# Patient Record
Sex: Female | Born: 1980
Health system: Southern US, Community
[De-identification: ages and names within clinical notes are randomized; demographics above are authoritative.]

## PROBLEM LIST (undated history)

## (undated) ENCOUNTER — Inpatient Hospital Stay (HOSPITAL_COMMUNITY): Payer: 59

## (undated) DIAGNOSIS — Z8759 Personal history of other complications of pregnancy, childbirth and the puerperium: Secondary | ICD-10-CM

## (undated) DIAGNOSIS — M549 Dorsalgia, unspecified: Secondary | ICD-10-CM

## (undated) DIAGNOSIS — F419 Anxiety disorder, unspecified: Secondary | ICD-10-CM

## (undated) DIAGNOSIS — F329 Major depressive disorder, single episode, unspecified: Secondary | ICD-10-CM

## (undated) DIAGNOSIS — B999 Unspecified infectious disease: Secondary | ICD-10-CM

## (undated) DIAGNOSIS — D649 Anemia, unspecified: Secondary | ICD-10-CM

## (undated) DIAGNOSIS — F32A Depression, unspecified: Secondary | ICD-10-CM

## (undated) DIAGNOSIS — IMO0002 Reserved for concepts with insufficient information to code with codable children: Secondary | ICD-10-CM

## (undated) DIAGNOSIS — S42309A Unspecified fracture of shaft of humerus, unspecified arm, initial encounter for closed fracture: Secondary | ICD-10-CM

## (undated) DIAGNOSIS — G8929 Other chronic pain: Secondary | ICD-10-CM

## (undated) DIAGNOSIS — F909 Attention-deficit hyperactivity disorder, unspecified type: Secondary | ICD-10-CM

## (undated) HISTORY — DX: Reserved for concepts with insufficient information to code with codable children: IMO0002

## (undated) HISTORY — DX: Anemia, unspecified: D64.9

## (undated) HISTORY — PX: TONSILLECTOMY: SUR1361

## (undated) HISTORY — DX: Unspecified fracture of shaft of humerus, unspecified arm, initial encounter for closed fracture: S42.309A

## (undated) HISTORY — DX: Anxiety disorder, unspecified: F41.9

## (undated) HISTORY — PX: BREAST REDUCTION SURGERY: SHX8

## (undated) HISTORY — DX: Dorsalgia, unspecified: M54.9

## (undated) HISTORY — DX: Other chronic pain: G89.29

## (undated) HISTORY — DX: Unspecified infectious disease: B99.9

## (undated) HISTORY — DX: Attention-deficit hyperactivity disorder, unspecified type: F90.9

## (undated) HISTORY — DX: Depression, unspecified: F32.A

---
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
## 2000-06-29 ENCOUNTER — Other Ambulatory Visit: Admission: RE | Admit: 2000-06-29 | Discharge: 2000-06-29 | Payer: Self-pay | Admitting: Obstetrics and Gynecology

## 2000-11-03 DIAGNOSIS — F419 Anxiety disorder, unspecified: Secondary | ICD-10-CM

## 2000-11-03 HISTORY — DX: Anxiety disorder, unspecified: F41.9

## 2000-11-03 HISTORY — PX: BREAST SURGERY: SHX581

## 2001-07-23 ENCOUNTER — Other Ambulatory Visit: Admission: RE | Admit: 2001-07-23 | Discharge: 2001-07-23 | Payer: Self-pay | Admitting: Obstetrics and Gynecology

## 2001-11-03 DIAGNOSIS — F909 Attention-deficit hyperactivity disorder, unspecified type: Secondary | ICD-10-CM

## 2001-11-03 HISTORY — DX: Attention-deficit hyperactivity disorder, unspecified type: F90.9

## 2003-11-04 DIAGNOSIS — R87619 Unspecified abnormal cytological findings in specimens from cervix uteri: Secondary | ICD-10-CM

## 2003-11-04 DIAGNOSIS — IMO0002 Reserved for concepts with insufficient information to code with codable children: Secondary | ICD-10-CM

## 2003-11-04 HISTORY — DX: Reserved for concepts with insufficient information to code with codable children: IMO0002

## 2003-11-04 HISTORY — DX: Unspecified abnormal cytological findings in specimens from cervix uteri: R87.619

## 2007-07-22 ENCOUNTER — Encounter: Admission: RE | Admit: 2007-07-22 | Discharge: 2007-07-22 | Payer: Self-pay | Admitting: Obstetrics and Gynecology

## 2007-08-02 ENCOUNTER — Inpatient Hospital Stay (HOSPITAL_COMMUNITY): Admission: AD | Admit: 2007-08-02 | Discharge: 2007-08-03 | Payer: Self-pay | Admitting: Obstetrics and Gynecology

## 2007-08-04 ENCOUNTER — Inpatient Hospital Stay (HOSPITAL_COMMUNITY): Admission: AD | Admit: 2007-08-04 | Discharge: 2007-08-06 | Payer: Self-pay | Admitting: Obstetrics and Gynecology

## 2011-03-04 ENCOUNTER — Emergency Department (HOSPITAL_COMMUNITY): Payer: 59

## 2011-03-04 ENCOUNTER — Emergency Department (HOSPITAL_COMMUNITY)
Admission: EM | Admit: 2011-03-04 | Discharge: 2011-03-04 | Disposition: A | Discharge status: Home / Self Care | Payer: 59 | Attending: Emergency Medicine | Admitting: Emergency Medicine

## 2011-03-04 DIAGNOSIS — R079 Chest pain, unspecified: Secondary | ICD-10-CM | POA: Insufficient documentation

## 2011-03-04 DIAGNOSIS — R42 Dizziness and giddiness: Secondary | ICD-10-CM | POA: Insufficient documentation

## 2011-03-18 NOTE — H&P (Signed)
Monica Mahoney               ACCOUNT NO.:  [id number]   MEDICAL RECORD NO.:  [id number]          PATIENT TYPE:  INP   LOCATION:  9167                          FACILITY:  [facility]   PHYSICIAN:  Hal Morales, M.D.DATE OF BIRTH:  10/24/1981   DATE OF ADMISSION:  08/04/2007  DATE OF DISCHARGE:                              HISTORY & PHYSICAL   [name] is a 30 year old married white female primigravida at 40  weeks, who presents with regular contractions every 4 minutes since 3  a.m.  She denies leaking, is having a small amount of bloody show.  Her  pregnancy has been followed by the [facility] certified  nurse midwife service and has been remarkable for:   1. Transfer to [facility] at 20 weeks' gestation.  2. Group B strep negative.   Her cervix in the office yesterday was 2 cm, 100% effaced.  She has an  induction of labor scheduled for August 06, 2007.  Prenatal labs were  collected on Mar 16, 2007.  Hemoglobin 13.3, hematocrit 37.9, platelets  253,000.  Blood type A+, antibody negative.  RPR nonreactive.  Hepatitis  B surface negative.  HIV nonreactive.  Pap smear within normal limits.  Gonorrhea negative.  Chlamydia negative.  Hemoglobin electrophoresis  within normal limits.  Varicella immune.  One-hour Glucola on May 04, 2007, was within normal limits.  Hemoglobin at that time was 12.5.  Culture of the vaginal tract for group B strep, gonorrhea and chlamydia  on July 02, 2007, were all negative.   HISTORY OF PRESENT PREGNANCY:  The patient presented for care at [facility] on Mar 16, 2007, at 19-6/7 weeks' gestation.  Anatomy  ultrasound on that date shows growth consistent with previous dating,  confirming EDC of August 04, 2007.  The patient had prenatal care in the  first trimester at a different location.  Ultrasound for fluid and  growth was done at 33 weeks' gestation and growth was within normal  limits, and AFI was at the 50th  percentile.  At 37 weeks the patient was  given Flexeril and Darvocet for back pain.  The rest of her prenatal  care has been unremarkable.   OB HISTORY:  She is a primigravida.   MEDICAL HISTORY:  She has no medication allergies.  She experienced  menarche at the age of 13 with 28-day cycles lasting 3-4 days.  She has  taken Seasonale and Yaz in the past for contraception.  She has had  history of abnormal Pap smears with colposcopy and 2005.  She reports  having had the usual childhood illnesses.   SURGICAL HISTORY:  Remarkable for tonsillectomy in 1991 and breast  reduction in 2002.   FAMILY MEDICAL HISTORY:  Paternal grandfather with heart disease.  The  patient's mother with chronic hypertension and also with varicose veins.  Maternal uncle and paternal grandmother with diabetes.  Maternal  grandmother with breast cancer.  Paternal grandparents with skin cancer.   GENETIC HISTORY:  Remarkable for history of kidney problems on the  father of the baby's side of the family.  SOCIAL HISTORY:  The patient is married.  Father of the baby's name is  Olena Leatherwood.  He is involved and supportive.  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  Father of baby has 15 years of  education and is a full-time paramedic.  They deny any alcohol, tobacco  or illicit drug use with the pregnancy.   OBJECTIVE:  VITAL SIGNS:  Stable.  She is afebrile.  HEENT:  Grossly within normal limits.  CHEST:  Clear to auscultation.  HEART:  Regular rate and rhythm.  ABDOMEN:  Gravid in contour with fundal height extending approximately  39 cm above the pubic symphysis.  Fetal heart rate is reactive and  reassuring.  Contractions are every 4-5 minutes.  Cervix is 3 cm, 100%  effaced, vertex -1, with intact membranes.  EXTREMITIES:  Normal.   ASSESSMENT:  1. Early labor.  2. Group B strep negative.   PLAN:  1. Is to admit to birthing suites.  2. Routine C.N.M. orders.  3. Plans IV  pain medication at present.  4. Anticipate normal spontaneous vaginal birth.      Cam Hai, C.N.M.      Hal Morales, M.D.  Electronically Signed    KS/MEDQ  D:  08/04/2007  T:  08/04/2007  Job:  [id number]

## 2011-03-18 NOTE — Discharge Summary (Signed)
NAME:  Monica Mahoney, Monica Mahoney               ACCOUNT NO.:  [id number]   MEDICAL RECORD NO.:  [id number]          PATIENT TYPE:  INP   LOCATION:  9108                          [facility]ACILITY:  [facility]   PHYSICIAN:  Monica Mahoney, M.D. DATE OF BIRTH:  1981-05-07   DATE OF ADMISSION:  08/04/2007  DATE OF DISCHARGE:  08/06/2007                               DISCHARGE SUMMARY   ADMITTING DIAGNOSES:  1. Intrauterine pregnancy at 40 weeks.  2. Transfer to [facility] at [redacted] weeks gestation.  3. Group B strep-negative.   DISCHARGE DIAGNOSES:  1. History of pregnancy at term.  2. Maternal exhaustion.   PROCEDURES:  1. Vacuum-assisted vaginal birth.  2. Epidural anesthesia.   HOSPITAL COURSE:  [name] is a 30 year old gravida 1, para 0 at 40  weeks who presented in early labor on the morning of August 04, 2007.  Her pregnancy had been remarkable for (1) transfer at 20 weeks to  [place], and (2) group B strep-negative.  On admission, she was  2 cm, 100%, vertex at a -2 station.  She ambulated for 1 hour.  Cervix  then changed to 3, and she was admitted for labor.  She had artificial  rupture of membranes accomplished at 12:55 p.m. with blood tinged fluid  noted.  Intrauterine pressure catheter was placed.  Epidural was also  placed for comfort.  This did work well for her.  She progressed to  completely dilated with assistance from Pitocin at 8:00 p.m.  She pushed  for 2 hours.  Dr. Estanislado Pandy was consulted at that time.  The patient at  that time was complaining of maternal exhaustion.  The baby was crowning  to an OA position.  The mushroom cup was applied by Dr. Estanislado Pandy with 2/2  contractions, and a midline episiotomy was performed.  The infant was a  viable female by the name of Monica Mahoney, weight 7 pounds 5 ounces.  Apgars were  8 and 9.  There was a midline episiotomy repaired in the usual fashion.  The infant was taken to the full-term nursery.  Mother was taken to  recovery in good  condition.   By postpartum day #1, the patient was doing well.  She was up ad lib.  She did have one episode of slight dizziness, but that did pass.  She  was breast feeding.  Her hemoglobin was 10.4, white blood cell count was  25.6 which was up from 21.1, and platelet count was 235.  She had been  febrile just during the second stage and did receive one dose of Unasyn.   By postpartum day #2, the patient was doing well.  She was planning an  IUD.  Her hemoglobin was 9.5, white blood cell count was down to 7.2,  and platelet count was 215.  The patient was having no issues with  syncope or dizziness.  Her vital signs were stable.  Her fundus was  firm.  Her perineum was healing.  Her lochia was scant.  Extremities  were within normal limits.  She was deemed to have received full benefit  of  her hospital stay and was discharged home.   DISCHARGE INSTRUCTIONS:  Discharge instructions per [facility]  handout.   DISCHARGE MEDICATIONS:  1. Motrin 600 mg p.o. q.6h. p.r.n. pain.  2. Percocet 5/325 1-2 p.o. q.3-4h. p.r.n. pain.   FOLLOW UP:  Discharge followup will occur in 5 weeks at [facility] for planned IUD at 6 weeks.      Monica Mahoney, C.N.M.      Monica Mahoney, M.D.  Electronically Signed    VLL/MEDQ  D:  08/06/2007  T:  08/06/2007  Job:  [id number]

## 2011-08-14 LAB — CBC
HCT: 26.8 — ABNORMAL LOW
HCT: 29 — ABNORMAL LOW
HCT: 37.9
Hemoglobin: 10.4 — ABNORMAL LOW
Hemoglobin: 13.3
Hemoglobin: 9.5 — ABNORMAL LOW
MCHC: 35.2
MCHC: 35.5
MCHC: 35.7
MCV: 97.3
MCV: 97.4
MCV: 97.7
Platelets: 215
Platelets: 235
Platelets: 262
RBC: 2.75 — ABNORMAL LOW
RBC: 2.97 — ABNORMAL LOW
RBC: 3.89
RDW: 12.5
RDW: 12.8
RDW: 12.8
WBC: 17.2 — ABNORMAL HIGH
WBC: 21.1 — ABNORMAL HIGH
WBC: 25.6 — ABNORMAL HIGH

## 2011-08-14 LAB — RPR: RPR Ser Ql: NONREACTIVE

## 2012-02-10 ENCOUNTER — Emergency Department (HOSPITAL_COMMUNITY)
Admission: EM | Admit: 2012-02-10 | Discharge: 2012-02-10 | Disposition: A | Discharge status: Home / Self Care | Payer: 59 | Source: Home / Self Care | Attending: Emergency Medicine | Admitting: Emergency Medicine

## 2012-02-10 ENCOUNTER — Encounter (HOSPITAL_COMMUNITY): Payer: Self-pay

## 2012-02-10 DIAGNOSIS — J040 Acute laryngitis: Secondary | ICD-10-CM

## 2012-02-10 HISTORY — DX: Attention-deficit hyperactivity disorder, unspecified type: F90.9

## 2012-02-10 MED ORDER — AMOXICILLIN 500 MG PO CAPS: 500.0000 mg | ORAL_CAPSULE | Freq: Three times a day (TID) | ORAL | Status: AC

## 2012-02-10 MED ORDER — FEXOFENADINE-PSEUDOEPHED ER 60-120 MG PO TB12: 1.0000 | ORAL_TABLET | Freq: Two times a day (BID) | ORAL | Status: DC

## 2012-02-10 NOTE — ED Provider Notes (Signed)
History     CSN: [id number]  Arrival date & time 02/10/12  0805   First MD Initiated Contact with Patient 02/10/12 [phone]      Chief Complaint  Patient presents with  . Cough  . Sore Throat    (Consider location/radiation/quality/duration/timing/severity/associated sxs/prior treatment) HPI Comments: Patient presents urgent care complaining of 3 day history of scratchy throat and her voice has been gotten hoarser. Patient describes cough goes from dry to some phlegm production. Got off a work shift feeling tired and fatigued "just not feeling well" its. No further symptoms such as shortness of breath, vomiting or nausea.. Denies any known fevers  Patient is a 31 y.o. female presenting with cough and pharyngitis. The history is provided by the patient.  Cough This is a new problem. The current episode started more than 2 days ago. The problem occurs constantly. The cough is productive of sputum. Associated symptoms include ear congestion and rhinorrhea. Pertinent negatives include no chest pain, no chills, no myalgias and no shortness of breath. She has tried decongestants for the symptoms. The treatment provided no relief. Her past medical history does not include pneumonia, COPD or asthma.  Sore Throat Pertinent negatives include no chest pain and no shortness of breath.    Past Medical History  Diagnosis Date  . ADHD (attention deficit hyperactivity disorder)     Past Surgical History  Procedure Date  . Breast reduction surgery   . Tonsillectomy     History reviewed. No pertinent family history.  History  Substance Use Topics  . Smoking status: Never Smoker   . Smokeless tobacco: Not on file  . Alcohol Use: Yes    OB History    Grav Para Term Preterm Abortions TAB SAB Ect Mult Living                  Review of Systems  Constitutional: Positive for appetite change and fatigue. Negative for chills.  HENT: Positive for congestion and rhinorrhea. Negative for neck pain  and neck stiffness.   Eyes: Negative for photophobia, pain and visual disturbance.  Respiratory: Positive for cough. Negative for shortness of breath.   Cardiovascular: Negative for chest pain, palpitations and leg swelling.  Genitourinary: Negative for dysuria.  Musculoskeletal: Negative for myalgias.    Allergies  Review of patient's allergies indicates no known allergies.  Home Medications   Current Outpatient Rx  Name Route Sig Dispense Refill  . ADDERALL XR PO Oral Take by mouth daily.    . AMOXICILLIN 500 MG PO CAPS Oral Take 1 capsule (500 mg total) by mouth 3 (three) times daily. 30 capsule 0  . FEXOFENADINE-PSEUDOEPHED ER 60-120 MG PO TB12 Oral Take 1 tablet by mouth every 12 (twelve) hours. 30 tablet 0    BP 137/79  Pulse 70  Temp(Src) 98.1 F (36.7 C) (Oral)  Resp 12  SpO2 98%  Physical Exam  Nursing note and vitals reviewed. Constitutional: She appears well-developed and well-nourished.  HENT:  Head: Normocephalic.  Right Ear: Tympanic membrane normal.  Left Ear: Tympanic membrane normal.  Mouth/Throat: Oropharynx is clear and moist and mucous membranes are normal. No oropharyngeal exudate, posterior oropharyngeal edema, posterior oropharyngeal erythema or tonsillar abscesses.  Eyes: Conjunctivae are normal.  Neck: Neck supple.  Cardiovascular: Normal rate.   Pulmonary/Chest: No respiratory distress. She has no decreased breath sounds. She has no wheezes. She has no rhonchi. She has no rales.  Lymphadenopathy:    She has no cervical adenopathy.  Skin: No rash  noted. No erythema.    ED Course  Procedures (including critical care time)  Labs Reviewed - No data to display No results found.   1. Laryngitis       MDM  Laryngitis symptomatology. Afebrile. Patient has some mild upper respiratory coexistent symptoms associated with. Patient looks comfortable afebrile. Symptomatic management recommended the next 40 872 hours prior to antibiotic usage along  with aggressive oral and upper airways hydration with a humidifier. Patient agree with treatment plan and followup care as necessary        Jimmie Molly, MD 02/10/12 1027

## 2012-02-10 NOTE — Discharge Instructions (Signed)
AS. discussed proceed to hydrate yourself aggressively use a humidifier at night, and start with a structured regimen with Allegra-D , if symptoms worsens or persists beyond 10-14 days it's reasonable to take an antibiotic.   Laryngitis Your exam shows you have laryngitis. This condition is due to inflammation around the vocal cords and causes hoarseness and cough. Laryngitis can often be related to a virus infection, excessive smoking, excessive talking or yelling, breathing toxic fumes, allergies, or reflux of acid from your stomach. Treatment is mainly voice rest. Talk as little as possible (this includes whispering). Use written notes to communicate until your voice is back to normal. Avoid smoking cigarettes, drink plenty of clear liquids, and rest frequently until all your symptoms have improved. You should be checked by your doctor if your hoarseness and cough are not improved after 5 days. Please see your doctor or go to the emergency right away if you have:  Trouble breathing or blood in your sputum.   Difficulty swallowing, persistent fever or increasing pain.  Document Released: 10/20/2005 Document Revised: 07/02/2011 Document Reviewed: 04/07/2007 [facility] Patient Information 2012 [place], [place].Laryngitis At the top of your windpipe is your voice box. It is the source of your voice. Inside your voice box are 2 bands of muscles called vocal cords. When you breathe, your vocal cords are relaxed and open so that air can get into the lungs. When you decide to say something, these cords come together and vibrate. The sound from these vibrations goes into your throat and comes out through your mouth as sound. Laryngitis is an inflammation of the vocal cords that causes hoarseness, cough, loss of voice, sore throat, and dry throat. Laryngitis can be temporary (acute) or long-term (chronic). Most cases of acute laryngitis improve with time.Chronic laryngitis lasts for more than 3  weeks. CAUSES Laryngitis can often be related to excessive smoking, talking, or yelling, as well as inhalation of toxic fumes and allergies. Acute laryngitis is usually caused by a viral infection, vocal strain, measles or mumps, or bacterial infections. Chronic laryngitis is usually caused by vocal cord strain, vocal cord injury, postnasal drip, growths on the vocal cords, or acid reflux. SYMPTOMS   Cough.   Sore throat.   Dry throat.  RISK FACTORS  Respiratory infections.   Exposure to irritating substances, such as cigarette smoke, excessive amounts of alcohol, stomach acids, and workplace chemicals.   Voice trauma, such as vocal cord injury from shouting or speaking too loud.  DIAGNOSIS  Your cargiver will perform a physical exam. During the physical exam, your caregiver will examine your throat. The most common sign of laryngitis is hoarseness. Laryngoscopy may be necessary to confirm the diagnosis of this condition. This procedure allows your caregiver to look into the larynx. HOME CARE INSTRUCTIONS  Drink enough fluids to keep your urine clear or pale yellow.   Rest until you no longer have symptoms or as directed by your caregiver.   Breathe in moist air.   Take all medicine as directed by your caregiver.   Do not smoke.   Talk as little as possible (this includes whispering).   Write on paper instead of talking until your voice is back to normal.   Follow up with your caregiver if your condition has not improved after 10 days.  SEEK MEDICAL CARE IF:   You have trouble breathing.   You cough up blood.   You have persistent fever.   You have increasing pain.   You have difficulty swallowing.  MAKE SURE YOU:  Understand these instructions.   Will watch your condition.   Will get help right away if you are not doing well or get worse.  Document Released: 10/20/2005 Document Revised: 10/09/2011 Document Reviewed: 12/26/2010 [facility] Patient Information  2012 [place], [place].

## 2012-02-10 NOTE — ED Notes (Signed)
Reports sx started 3 days ago with scratchy throat and hoarseness.  Has developed post nasal drainage and prod. Cough and just doesn't feel well.  Denies known fever.

## 2012-03-26 ENCOUNTER — Encounter: Payer: Self-pay | Admitting: Obstetrics and Gynecology

## 2012-03-31 ENCOUNTER — Encounter: Payer: Self-pay | Admitting: Obstetrics and Gynecology

## 2012-03-31 ENCOUNTER — Ambulatory Visit (INDEPENDENT_AMBULATORY_CARE_PROVIDER_SITE_OTHER): Payer: 59 | Admitting: Obstetrics and Gynecology

## 2012-03-31 VITALS — BP 120/74 | Resp 16 | Ht 61.5 in | Wt 121.0 lb

## 2012-03-31 DIAGNOSIS — Z309 Encounter for contraceptive management, unspecified: Secondary | ICD-10-CM

## 2012-03-31 NOTE — Progress Notes (Signed)
[name] is a 31 y.o. year old female,G1P1001, who presents for a problem visit. The patient is ready to conceive and she will her Mirena IUD removed.  Subjective:  Excited about getting pregnant.  Objective:  BP 120/74  Resp 16  Ht 5' 1.5" (1.562 m)  Wt 121 lb (54.885 kg)  BMI 22.49 kg/m2   GI: soft, non-tender; bowel sounds normal; no masses,  no organomegaly  External genitalia: normal general appearance Vaginal: normal without tenderness, induration or masses Cervix: normal appearance and IUD string visualized Adnexa: normal bimanual exam Uterus: normal size shape and consistency  Procedure:  IUD string visualized.  Ring forceps used to remove the IUD without complications.  Tolerated well.  Assessment:  Once Mirena IUD removed Preconception  Plan:  The patient was told to begin prenatal vitamins.  Cats, soft cheese, undercooked lunch meat, predator fish, exercise, and diet were all reviewed.  Prenatal laboratory testing discussed but declined.  Return to office in time for her annual exam or once she becomes pregnant.   Leonard Schwartz M.D.  03/31/2012 11:33 AM

## 2012-04-08 ENCOUNTER — Ambulatory Visit: Payer: Self-pay | Admitting: Obstetrics and Gynecology

## 2012-06-22 ENCOUNTER — Encounter: Payer: Self-pay | Admitting: Obstetrics and Gynecology

## 2012-06-22 ENCOUNTER — Ambulatory Visit (INDEPENDENT_AMBULATORY_CARE_PROVIDER_SITE_OTHER): Payer: 59 | Admitting: Obstetrics and Gynecology

## 2012-06-22 ENCOUNTER — Telehealth: Payer: Self-pay | Admitting: Obstetrics and Gynecology

## 2012-06-22 VITALS — BP 104/68 | Resp 16 | Ht 61.0 in | Wt 125.0 lb

## 2012-06-22 DIAGNOSIS — Z3200 Encounter for pregnancy test, result unknown: Secondary | ICD-10-CM

## 2012-06-22 DIAGNOSIS — N939 Abnormal uterine and vaginal bleeding, unspecified: Secondary | ICD-10-CM

## 2012-06-22 DIAGNOSIS — Z202 Contact with and (suspected) exposure to infections with a predominantly sexual mode of transmission: Secondary | ICD-10-CM

## 2012-06-22 DIAGNOSIS — N898 Other specified noninflammatory disorders of vagina: Secondary | ICD-10-CM

## 2012-06-22 DIAGNOSIS — Z2089 Contact with and (suspected) exposure to other communicable diseases: Secondary | ICD-10-CM

## 2012-06-22 LAB — POCT URINE PREGNANCY: Preg Test, Ur: NEGATIVE

## 2012-06-22 LAB — POCT URINALYSIS DIPSTICK
Bilirubin, UA: NEGATIVE
Blood, UA: >3
Glucose, UA: NEGATIVE
Nitrite, UA: NEGATIVE
Spec Grav, UA: 1.02
Urobilinogen, UA: 4
pH, UA: 7

## 2012-06-22 NOTE — Telephone Encounter (Signed)
Pt called, states is 5 wks, and has been having vaginal bleeding.  Has to wear a pad, bldg is bright right in color.  Says picked up 40lb son today to put into the car and felt like something had pulled.  Last IC was last week.  Pt denies any pain.  Pt sched for eval today w/ VL today @ 1500.

## 2012-06-22 NOTE — Progress Notes (Signed)
lmp 05/16/2012 bleeding stared this a.m. Last intercourse last week.

## 2012-06-22 NOTE — Progress Notes (Signed)
Here for +UPT last month, now with bleeding today. No pain. Had Mirena removed in May.  Light cycle in June, heavy cycle in July. Desired pregnancy.  UPT today negative x 2. GC, chlamydia today. QHCG drawn. Will f/u with patient tomorrow re: [facility] result. Support to patient for likely SAB.

## 2012-06-23 ENCOUNTER — Telehealth: Payer: Self-pay

## 2012-06-23 LAB — GC/CHLAMYDIA PROBE AMP, GENITAL
Chlamydia, DNA Probe: NEGATIVE
GC Probe Amp, Genital: NEGATIVE

## 2012-06-23 LAB — HCG, QUANTITATIVE, PREGNANCY: hCG, Beta Chain, Quant, S: 17.9 m[IU]/mL

## 2012-06-23 NOTE — Telephone Encounter (Signed)
Pt was called and advised to come in one day next week to have quant done. Lab ordered. Pt canceled NOB appt. following miscarriage. Pt voice understanding. Mathis Bud

## 2012-06-25 ENCOUNTER — Telehealth: Payer: Self-pay

## 2012-06-25 NOTE — Telephone Encounter (Signed)
A user error has taken place: encounter opened in error, closed for administrative reasons.

## 2012-06-25 NOTE — Telephone Encounter (Signed)
Message copied by Janeece Agee on Fri Jun 25, 2012 10:49 AM ------      Message from: Cornelius Moras      Created: Wed Jun 23, 2012 11:13 AM       Please call patient and advise of very low QHCG, probable SAB.      Would recommend repeat in 1 week to ensure value drops to <5--this verifies the process has been completed.      Offer support and condolences....Marland KitchenMarland Kitchen

## 2012-06-25 NOTE — Telephone Encounter (Signed)
Pam called pt yesterday for me regarding lab quant result & another quant needed for next week per VL. Didn't see a quant scheduled for next week. Called pt to schedule the appt. Scheduled pt 06/30/12 @10am  for the quant. Pt agrees and voices understanding.

## 2012-06-30 ENCOUNTER — Other Ambulatory Visit: Payer: 59

## 2012-07-01 ENCOUNTER — Other Ambulatory Visit: Payer: 59

## 2012-07-01 DIAGNOSIS — O039 Complete or unspecified spontaneous abortion without complication: Secondary | ICD-10-CM

## 2012-07-01 LAB — HCG, QUANTITATIVE, PREGNANCY: hCG, Beta Chain, Quant, S: <1 m[IU]/mL

## 2012-07-01 NOTE — Progress Notes (Signed)
Quick Note:  QHCG has resolved to negative--please call patient and advise. No further QHCGs needed  ______

## 2012-07-06 ENCOUNTER — Telehealth: Payer: Self-pay

## 2012-07-06 NOTE — Telephone Encounter (Signed)
Spoke with pt informing her Monica Mahoney is neg and no further testing is needed. Pt also questioned if pap was done during 06/22/12. Informed pt only GC/CT which was neg. Pt agrees and voices understanding.

## 2012-09-08 ENCOUNTER — Ambulatory Visit (INDEPENDENT_AMBULATORY_CARE_PROVIDER_SITE_OTHER): Payer: 59 | Admitting: Obstetrics and Gynecology

## 2012-09-08 DIAGNOSIS — O26849 Uterine size-date discrepancy, unspecified trimester: Secondary | ICD-10-CM

## 2012-09-08 DIAGNOSIS — Z331 Pregnant state, incidental: Secondary | ICD-10-CM

## 2012-09-08 LAB — POCT URINALYSIS DIPSTICK
Bilirubin, UA: NEGATIVE
Blood, UA: NEGATIVE
Glucose, UA: NEGATIVE
Ketones, UA: NEGATIVE
Leukocytes, UA: NEGATIVE
Nitrite, UA: NEGATIVE
Protein, UA: NEGATIVE
Spec Grav, UA: 1.02
Urobilinogen, UA: NEGATIVE
pH, UA: 5

## 2012-09-08 NOTE — Progress Notes (Signed)
NOB Interview. Per protocol scheduled for U/s for dating/viability.

## 2012-09-09 LAB — PRENATAL PANEL VII
Antibody Screen: NEGATIVE
Basophils Absolute: 0 10*3/uL (ref 0.0–0.1)
Basophils Relative: 0 % (ref 0–1)
Eosinophils Absolute: 0.1 10*3/uL (ref 0.0–0.7)
Eosinophils Relative: 1 % (ref 0–5)
HCT: 37.5 % (ref 36.0–46.0)
HIV: NONREACTIVE
Hemoglobin: 12.9 g/dL (ref 12.0–15.0)
Hepatitis B Surface Ag: NEGATIVE
Lymphocytes Relative: 24 % (ref 12–46)
Lymphs Abs: 2.1 10*3/uL (ref 0.7–4.0)
MCH: 32.5 pg (ref 26.0–34.0)
MCHC: 34.4 g/dL (ref 30.0–36.0)
MCV: 94.5 fL (ref 78.0–100.0)
Monocytes Absolute: 0.5 10*3/uL (ref 0.1–1.0)
Monocytes Relative: 5 % (ref 3–12)
Neutro Abs: 6.4 10*3/uL (ref 1.7–7.7)
Neutrophils Relative %: 70 % (ref 43–77)
Platelets: 267 10*3/uL (ref 150–400)
RBC: 3.97 MIL/uL (ref 3.87–5.11)
RDW: 12.4 % (ref 11.5–15.5)
Rh Type: POSITIVE
Rubella: 98.1 IU/mL — ABNORMAL HIGH
WBC: 9 10*3/uL (ref 4.0–10.5)

## 2012-09-10 LAB — CULTURE, OB URINE
Colony Count: NO GROWTH
Organism ID, Bacteria: NO GROWTH

## 2012-09-13 ENCOUNTER — Ambulatory Visit (INDEPENDENT_AMBULATORY_CARE_PROVIDER_SITE_OTHER): Payer: 59

## 2012-09-13 ENCOUNTER — Ambulatory Visit (INDEPENDENT_AMBULATORY_CARE_PROVIDER_SITE_OTHER): Payer: 59 | Admitting: Obstetrics and Gynecology

## 2012-09-13 ENCOUNTER — Encounter: Payer: Self-pay | Admitting: Obstetrics and Gynecology

## 2012-09-13 VITALS — BP 104/66 | Wt 131.0 lb

## 2012-09-13 DIAGNOSIS — Z331 Pregnant state, incidental: Secondary | ICD-10-CM

## 2012-09-13 DIAGNOSIS — O26849 Uterine size-date discrepancy, unspecified trimester: Secondary | ICD-10-CM

## 2012-09-13 NOTE — Progress Notes (Signed)
[redacted]w[redacted]d Sono: AV uterus Single IUP with + FHT FHR is only 120  Suggest f/u in 7-10 days Amnion and yolk sac seen

## 2012-09-13 NOTE — Patient Instructions (Signed)
ABCs of Pregnancy  A  Antepartum care is very important. Be sure you see your doctor and get prenatal care as soon as you think you are pregnant. At this time, you will be tested for infection, genetic abnormalities and potential problems with you and the pregnancy. This is the time to discuss diet, exercise, work, medications, labor, pain medication during labor and the possibility of a cesarean delivery. Ask any questions that may concern you. It is important to see your doctor regularly throughout your pregnancy. Avoid exposure to toxic substances and chemicals - such as cleaning solvents, lead and mercury, some insecticides, and paint. Pregnant women should avoid exposure to paint fumes, and fumes that cause you to feel ill, dizzy or faint. When possible, it is a good idea to have a pre-pregnancy consultation with your caregiver to begin some important recommendations your caregiver suggests such as, taking folic acid, exercising, quitting smoking, avoiding alcoholic beverages, etc.  B  Breastfeeding is the healthiest choice for both you and your baby. It has many nutritional benefits for the baby and health benefits for the mother. It also creates a very tight and loving bond between the baby and mother. Talk to your doctor, your family and friends, and your employer about how you choose to feed your baby and how they can support you in your decision. Not all birth defects can be prevented, but a woman can take actions that may increase her chance of having a healthy baby. Many birth defects happen very early in pregnancy, sometimes before a woman even knows she is pregnant. Birth defects or abnormalities of any child in your or the father's family should be discussed with your caregiver. Get a good support bra as your breast size changes. Wear it especially when you exercise and when nursing.   C  Celebrate the news of your pregnancy with the your spouse/father and family. Childbirth classes are helpful to  take for you and the spouse/father because it helps to understand what happens during the pregnancy, labor and delivery. Cesarean delivery should be discussed with your doctor so you are prepared for that possibility. The pros and cons of circumcision if it is a boy, should be discussed with your pediatrician. Cigarette smoking during pregnancy can result in low birth weight babies. It has been associated with infertility, miscarriages, tubal pregnancies, infant death (mortality) and poor health (morbidity) in childhood. Additionally, cigarette smoking may cause long-term learning disabilities. If you smoke, you should try to quit before getting pregnant and not smoke during the pregnancy. Secondary smoke may also harm a mother and her developing baby. It is a good idea to ask people to stop smoking around you during your pregnancy and after the baby is born. Extra calcium is necessary when you are pregnant and is found in your prenatal vitamin, in dairy products, green leafy vegetables and in calcium supplements.  D  A healthy diet according to your current weight and height, along with vitamins and mineral supplements should be discussed with your caregiver. Domestic abuse or violence should be made known to your doctor right away to get the situation corrected. Drink more water when you exercise to keep hydrated. Discomfort of your back and legs usually develops and progresses from the middle of the second trimester through to delivery of the baby. This is because of the enlarging baby and uterus, which may also affect your balance. Do not take illegal drugs. Illegal drugs can seriously harm the baby and you. Drink extra   fluids (water is best) throughout pregnancy to help your body keep up with the increases in your blood volume. Drink at least 6 to 8 glasses of water, fruit juice, or milk each day. A good way to know you are drinking enough fluid is when your urine looks almost like clear water or is very light  yellow.   E  Eat healthy to get the nutrients you and your unborn baby need. Your meals should include the five basic food groups. Exercise (30 minutes of light to moderate exercise a day) is important and encouraged during pregnancy, if there are no medical problems or problems with the pregnancy. Exercise that causes discomfort or dizziness should be stopped and reported to your caregiver. Emotions during pregnancy can change from being ecstatic to depression and should be understood by you, your partner and your family.  F  Fetal screening with ultrasound, amniocentesis and monitoring during pregnancy and labor is common and sometimes necessary. Take 400 micrograms of folic acid daily both before, when possible, and during the first few months of pregnancy to reduce the risk of birth defects of the brain and spine. All women who could possibly become pregnant should take a vitamin with folic acid, every day. It is also important to eat a healthy diet with fortified foods (enriched grain products, including cereals, rice, breads, and pastas) and foods with natural sources of folate (orange juice, green leafy vegetables, beans, peanuts, broccoli, asparagus, peas, and lentils). The father should be involved with all aspects of the pregnancy including, the prenatal care, childbirth classes, labor, delivery, and postpartum time. Fathers may also have emotional concerns about being a father, financial needs, and raising a family.  G  Genetic testing should be done appropriately. It is important to know your family and the father's history. If there have been problems with pregnancies or birth defects in your family, report these to your doctor. Also, genetic counselors can talk with you about the information you might need in making decisions about having a family. You can call a major medical center in your area for help in finding a board-certified genetic counselor. Genetic testing and counseling should be done  before pregnancy when possible, especially if there is a history of problems in the mother's or father's family. Certain ethnic backgrounds are more at risk for genetic defects.  H  Get familiar with the hospital where you will be having your baby. Get to know how long it takes to get there, the labor and delivery area, and the hospital procedures. Be sure your medical insurance is accepted there. Get your home ready for the baby including, clothes, the baby's room (when possible), furniture and car seat. Hand washing is important throughout the day, especially after handling raw meat and poultry, changing the baby's diaper or using the bathroom. This can help prevent the spread of many bacteria and viruses that cause infection. Your hair may become dry and thinner, but will return to normal a few weeks after the baby is born. Heartburn is a common problem that can be treated by taking antacids recommended by your caregiver, eating smaller meals 5 or 6 times a day, not drinking liquids when eating, drinking between meals and raising the head of your bed 2 to 3 inches.  I  Insurance to cover you, the baby, doctor and hospital should be reviewed so that you will be prepared to pay any costs not covered by your insurance plan. If you do not have medical insurance,   there are usually clinics and services available for you in your community. Take 30 milligrams of iron during your pregnancy as prescribed by your doctor to reduce the risk of low red blood cells (anemia) later in pregnancy. All women of childbearing age should eat a diet rich in iron.  J  There should be a joint effort for the mother, father and any other children to adapt to the pregnancy financially, emotionally, and psychologically during the pregnancy. Join a support group for moms-to-be. Or, join a class on parenting or childbirth. Have the family participate when possible.  K  Know your limits. Let your caregiver know if you experience any of the  following:   · Pain of any kind.  · Strong cramps.  · You develop a lot of weight in a short period of time (5 pounds in 3 to 5 days).  · Vaginal bleeding, leaking of amniotic fluid.  · Headache, vision problems.  · Dizziness, fainting, shortness of breath.  · Chest pain.  · Fever of 102° F (38.9° C) or higher.  · Gush of clear fluid from your vagina.  · Painful urination.  · Domestic violence.  · Irregular heartbeat (palpitations).  · Rapid beating of the heart (tachycardia).  · Constant feeling sick to your stomach (nauseous) and vomiting.  · Trouble walking, fluid retention (edema).  · Muscle weakness.  · If your baby has decreased activity.  · Persistent diarrhea.  · Abnormal vaginal discharge.  · Uterine contractions at 20-minute intervals.  · Back pain that travels down your leg.  L  Learn and practice that what you eat and drink should be in moderation and healthy for you and your baby. Legal drugs such as alcohol and caffeine are important issues for pregnant women. There is no safe amount of alcohol a woman can drink while pregnant. Fetal alcohol syndrome, a disorder characterized by growth retardation, facial abnormalities, and central nervous system dysfunction, is caused by a woman's use of alcohol during pregnancy. Caffeine, found in tea, coffee, soft drinks and chocolate, should also be limited. Be sure to read labels when trying to cut down on caffeine during pregnancy. More than 200 foods, beverages, and over-the-counter medications contain caffeine and have a high salt content! There are coffees and teas that do not contain caffeine.  M  Medical conditions such as diabetes, epilepsy, and high blood pressure should be treated and kept under control before pregnancy when possible, but especially during pregnancy. Ask your caregiver about any medications that may need to be changed or adjusted during pregnancy. If you are currently taking any medications, ask your caregiver if it is safe to take them  while you are pregnant or before getting pregnant when possible. Also, be sure to discuss any herbs or vitamins you are taking. They are medicines, too! Discuss with your doctor all medications, prescribed and over-the-counter, that you are taking. During your prenatal visit, discuss the medications your doctor may give you during labor and delivery.  N  Never be afraid to ask your doctor or caregiver questions about your health, the progress of the pregnancy, family problems, stressful situations, and recommendation for a pediatrician, if you do not have one. It is better to take all precautions and discuss any questions or concerns you may have during your office visits. It is a good idea to write down your questions before you visit the doctor.  O  Over-the-counter cough and cold remedies may contain alcohol or other ingredients that should   be avoided during pregnancy. Ask your caregiver about prescription, herbs or over-the-counter medications that you are taking or may consider taking while pregnant.   P  Physical activity during pregnancy can benefit both you and your baby by lessening discomfort and fatigue, providing a sense of well-being, and increasing the likelihood of early recovery after delivery. Light to moderate exercise during pregnancy strengthens the belly (abdominal) and back muscles. This helps improve posture. Practicing yoga, walking, swimming, and cycling on a stationary bicycle are usually safe exercises for pregnant women. Avoid scuba diving, exercise at high altitudes (over 3000 feet), skiing, horseback riding, contact sports, etc. Always check with your doctor before beginning any kind of exercise, especially during pregnancy and especially if you did not exercise before getting pregnant.  Q  Queasiness, stomach upset and morning sickness are common during pregnancy. Eating a couple of crackers or dry toast before getting out of bed. Foods that you normally love may make you feel sick to  your stomach. You may need to substitute other nutritious foods. Eating 5 or 6 small meals a day instead of 3 large ones may make you feel better. Do not drink with your meals, drink between meals. Questions that you have should be written down and asked during your prenatal visits.  R  Read about and make plans to baby-proof your home. There are important tips for making your home a safer environment for your baby. Review the tips and make your home safer for you and your baby. Read food labels regarding calories, salt and fat content in the food.  S  Saunas, hot tubs, and steam rooms should be avoided while you are pregnant. Excessive high heat may be harmful during your pregnancy. Your caregiver will screen and examine you for sexually transmitted diseases and genetic disorders during your prenatal visits. Learn the signs of labor. Sexual relations while pregnant is safe unless there is a medical or pregnancy problem and your caregiver advises against it.  T  Traveling long distances should be avoided especially in the third trimester of your pregnancy. If you do have to travel out of state, be sure to take a copy of your medical records and medical insurance plan with you. You should not travel long distances without seeing your doctor first. Most airlines will not allow you to travel after 36 weeks of pregnancy. Toxoplasmosis is an infection caused by a parasite that can seriously harm an unborn baby. Avoid eating undercooked meat and handling cat litter. Be sure to wear gloves when gardening. Tingling of the hands and fingers is not unusual and is due to fluid retention. This will go away after the baby is born.  U  Womb (uterus) size increases during the first trimester. Your kidneys will begin to function more efficiently. This may cause you to feel the need to urinate more often. You may also leak urine when sneezing, coughing or laughing. This is due to the growing uterus pressing against your bladder,  which lies directly in front of and slightly under the uterus during the first few months of pregnancy. If you experience burning along with frequency of urination or bloody urine, be sure to tell your doctor. The size of your uterus in the third trimester may cause a problem with your balance. It is advisable to maintain good posture and avoid wearing high heels during this time. An ultrasound of your baby may be necessary during your pregnancy and is safe for you and your baby.  V    Vaccinations are an important concern for pregnant women. Get needed vaccines before pregnancy. Center for Disease Control (www.cdc.gov) has clear guidelines for the use of vaccines during pregnancy. Review the list, be sure to discuss it with your doctor. Prenatal vitamins are helpful and healthy for you and the baby. Do not take extra vitamins except what is recommended. Taking too much of certain vitamins can cause overdose problems. Continuous vomiting should be reported to your caregiver. Varicose veins may appear especially if there is a family history of varicose veins. They should subside after the delivery of the baby. Support hose helps if there is leg discomfort.  W  Being overweight or underweight during pregnancy may cause problems. Try to get within 15 pounds of your ideal weight before pregnancy. Remember, pregnancy is not a time to be dieting! Do not stop eating or start skipping meals as your weight increases. Both you and your baby need the calories and nutrition you receive from a healthy diet. Be sure to consult with your doctor about your diet. There is a formula and diet plan available depending on whether you are overweight or underweight. Your caregiver or nutritionist can help and advise you if necessary.  X  Avoid X-rays. If you must have dental work or diagnostic tests, tell your dentist or physician that you are pregnant so that extra care can be taken. X-rays should only be taken when the risks of not taking  them outweigh the risk of taking them. If needed, only the minimum amount of radiation should be used. When X-rays are necessary, protective lead shields should be used to cover areas of the body that are not being X-rayed.  Y  Your baby loves you. Breastfeeding your baby creates a loving and very close bond between the two of you. Give your baby a healthy environment to live in while you are pregnant. Infants and children require constant care and guidance. Their health and safety should be carefully watched at all times. After the baby is born, rest or take a nap when the baby is sleeping.  Z  Get your ZZZs. Be sure to get plenty of rest. Resting on your side as often as possible, especially on your left side is advised. It provides the best circulation to your baby and helps reduce swelling. Try taking a nap for 30 to 45 minutes in the afternoon when possible. After the baby is born rest or take a nap when the baby is sleeping. Try elevating your feet for that amount of time when possible. It helps the circulation in your legs and helps reduce swelling.   Most information courtesy of the CDC.  Document Released: 10/20/2005 Document Revised: 01/12/2012 Document Reviewed: 07/04/2009  [employer]® Patient Information ©2013 [employer].

## 2012-09-14 ENCOUNTER — Telehealth: Payer: Self-pay | Admitting: Obstetrics and Gynecology

## 2012-09-14 LAB — US OB COMP LESS 14 WKS

## 2012-09-14 NOTE — Telephone Encounter (Signed)
TC to pt. Clarified she is not taking Adderall.

## 2012-09-14 NOTE — Telephone Encounter (Signed)
Message copied by Mason Jim on Tue Sep 14, 2012 11:53 AM ------      Message from: Malissa Hippo.      Created: Fri Sep 10, 2012  9:47 AM      Regarding: question about current meds        I saw she is on adderall? Is she still taking it, not recommended during preg, even tho it's cat C, in first trimester slight risk of fetal complications, rest of preg, risk of fetal dependence.             Thanks      SL                   ----- Message -----         From: Constance Haw, RN         Sent: 09/08/2012  12:11 PM           To: Malissa Hippo, CNM

## 2012-10-06 ENCOUNTER — Encounter: Payer: 59 | Admitting: Obstetrics and Gynecology

## 2012-10-07 ENCOUNTER — Encounter: Payer: Self-pay | Admitting: Obstetrics and Gynecology

## 2012-10-15 ENCOUNTER — Ambulatory Visit (INDEPENDENT_AMBULATORY_CARE_PROVIDER_SITE_OTHER): Payer: 59 | Admitting: Obstetrics and Gynecology

## 2012-10-15 ENCOUNTER — Ambulatory Visit (INDEPENDENT_AMBULATORY_CARE_PROVIDER_SITE_OTHER): Payer: 59

## 2012-10-15 ENCOUNTER — Encounter: Payer: Self-pay | Admitting: Obstetrics and Gynecology

## 2012-10-15 VITALS — BP 110/78 | Wt 139.0 lb

## 2012-10-15 DIAGNOSIS — O039 Complete or unspecified spontaneous abortion without complication: Secondary | ICD-10-CM

## 2012-10-15 DIAGNOSIS — IMO0002 Reserved for concepts with insufficient information to code with codable children: Secondary | ICD-10-CM

## 2012-10-15 DIAGNOSIS — Z331 Pregnant state, incidental: Secondary | ICD-10-CM

## 2012-10-15 DIAGNOSIS — O021 Missed abortion: Secondary | ICD-10-CM

## 2012-10-15 DIAGNOSIS — Z124 Encounter for screening for malignant neoplasm of cervix: Secondary | ICD-10-CM

## 2012-10-15 DIAGNOSIS — O368390 Maternal care for abnormalities of the fetal heart rate or rhythm, unspecified trimester, not applicable or unspecified: Secondary | ICD-10-CM

## 2012-10-15 LAB — POCT URINALYSIS DIPSTICK
Bilirubin, UA: NEGATIVE
Blood, UA: NEGATIVE
Glucose, UA: NEGATIVE
Ketones, UA: NEGATIVE
Leukocytes, UA: NEGATIVE
Nitrite, UA: NEGATIVE
Protein, UA: NEGATIVE
Spec Grav, UA: 1.01
Urobilinogen, UA: NEGATIVE
pH, UA: 7

## 2012-10-15 LAB — US OB LIMITED

## 2012-10-15 NOTE — Progress Notes (Signed)
[redacted]w[redacted]d  Pt states w/ intercourse vaginal discharge is thick. Pt miscarried in 06-2012 , and asked about getting a U/S today.   Last Pap: 12/16/2010 Pt declined Genetic Testing. Pt states she has no concerns today.

## 2012-10-15 NOTE — Progress Notes (Signed)
Pt here for NOB work-up at [redacted]w[redacted]d Denies any vag bleeding or cramping Unable to hear FHT's US shows anteverted uterus, [redacted]w[redacted]d incomplete AB, normal ovaries, no fluid in CDS, normal adnexas No cardiac activity  Discussed options w pt and sig other: 1. Expectant mgmnt 2. cytotec 3. D&E Pt elects to have D&E, would like to have done on Monday Will d/w Dr Pennie Rushing and f/u w pt for scheduling rv'd bleeding precautions  S.Gratia Disla, CNM

## 2012-10-15 NOTE — Patient Instructions (Signed)
Incomplete Miscarriage Miscarriages in pregnancy are common. A miscarriage is a pregnancy that has ended before the twentieth week. You have had an incomplete miscarriage. Partial parts of the fetus or placenta (afterbirth) remain behind. Sometimes further treatment is needed. The most common reason for further treatment is continued bleeding (hemorrhage). Tissue left behind may also become infected. Treatment usually is curettage. Curettage for an incomplete abortion is a procedure in which the remaining products of pregnancy are removed. This can be done by a simple sucking procedure (suction curettage). It can also be done by a simple scraping (curettage) of the inside of the uterus (womb). This may be done in the hospital or in the caregiver's office. This is only done when your caregiver knows the pregnancy has ended. This is determined by physical examination and a negative pregnancy test. It may also include an ultrasound to confirm a dead fetus. The ultrasound may also prove that products of the pregnancy remain in the uterus. If your cervix remains dilated and you are still passing clots and tissue, your caregiver may wish to watch you for a little while. Your caregiver may want to see if you are going to finish passing all of the remaining parts of the pregnancy. If the bleeding continues, they may proceed with curettage. WHY DO I FEEL THIS WAY Miscarriages can be a very emotional time for prospective mothers. This is not you or your partner's fault. The miscarriage did not occur because of a lack in you or your partner. Nearly all miscarriages occur because the pregnancy has started off wrongly. At least half of miscarried pregnancies have a chromosomal abnormality (almost always not inherited). Others may have developmental problems with the fetus or placentas. Problems may not show up even when the products miscarried are studied under the microscope. You can usually begin trying for another  pregnancy as soon as your caregiver says it's okay. HOME CARE INSTRUCTIONS   Your caregiver may order bed rest (this means only getting up to use the bathroom). Your caregiver may allow you to continue light activity. If curettage was not done at this time, but you require further treatment.  Keep track of the number of pads you use each day. Keep track of how saturated (soaked) they are. Record this information.  Do not use tampons. Do not douche or have sexual intercourse until approved by your caregiver.  It is very important to keep all follow-up appointments for re-evaluation and continuing management.  Women who have an Rh negative blood type (ie, A, B, AB, or O negative) need to receive a drug called Rh(D) immune globulin. This medicine helps protect future fetuses against problems that can occur if an Rh negative mother is carrying a baby who is Rh positive. SEEK IMMEDIATE MEDICAL CARE IF:   You experience severe cramps in your stomach, back, or abdomen.  You run an unexplained temperature (record these).  You pass large clots or tissue (save any tissue for your caregiver to inspect).  Your bleeding increases or you become light-headed, weak, or have fainting episodes. MAKE SURE YOU:   Understand these instructions.  Will watch your condition.  Will get help right away if you are not doing well or get worse. Document Released: 10/20/2005 Document Revised: 01/12/2012 Document Reviewed: 06/09/2008 [facility] Patient Information 2013 [place], [place].  Dilation and Curettage or Vacuum Curettage Dilation and curettage (D&C) and vacuum curettage are minor procedures. A D&C involves stretching (dilation) the cervix and scraping (curettage) the inside lining of the  womb (uterus). During a D&C, tissue is gently scraped from the inside lining of the uterus. During a vacuum curettage, the lining and tissue in the uterus are removed with the use of gentle suction. Curettage may be  performed for diagnostic or therapeutic purposes. As a diagnostic procedure, curettage is performed for the purpose of examining tissues from the uterus. Tissue examination may help determine causes or treatment options for symptoms. A diagnostic curettage may be performed for the following symptoms:  Irregular bleeding in the uterus.  Bleeding with the development of clots.  Spotting between menstrual periods.  Prolonged menstrual periods.  Bleeding after menopause.  No menstrual period (amenorrhea).  A change in size and shape of the uterus. A therapeutic curettage is performed to remove tissue, blood, or a contraceptive device. Therapeutic curettage may be performed for the following conditions:   Removal of an IUD (intrauterine device).  Removal of retained placenta after giving birth. Retained placenta can cause bleeding severe enough to require transfusions or an infection.  Abortion.  Miscarriage.  Removal of polyps inside the uterus.  Removal of uncommon types of fibroids (noncancerous lumps). LET YOUR CAREGIVER KNOW ABOUT:   Allergies to food or medicine.  Medicines taken, including vitamins, herbs, eyedrops, over-the-counter medicines, and creams.  Use of steroids (by mouth or creams).  Previous problems with anesthetics or numbing medicines.  History of bleeding problems or blood clots.  Previous surgery.  Other health problems, including diabetes and kidney problems.  Possibility of pregnancy, if this applies. RISKS AND COMPLICATIONS   Excessive bleeding.  Infection of the uterus.  Damage to the cervix.  Development of scar tissue (adhesions) inside the uterus, later causing abnormal amounts of menstrual bleeding.  Complications from the general anesthetic, if a general anesthetic is used.  Putting a hole (perforation) in the uterus. This is rare. BEFORE THE PROCEDURE   Eat and drink before the procedure only as directed by your  caregiver.  Arrange for someone to take you home. PROCEDURE   This procedure may be done in a hospital, outpatient clinic, or caregiver's office.  You may be given a general anesthetic or a local anesthetic in and around the cervix.  You will lie on your back with your legs in stirrups.  There are two ways in which your cervix can be softened and dilated. These include:  Taking a medicine.  Having thin rods (laminaria) inserted into your cervix.  A curved tool (curette) will scrape cells from the inside lining of the uterus and will then be removed. This procedure usually takes about 15 to 30 minutes. AFTER THE PROCEDURE   You will rest in the recovery area until you are stable and are ready to go home.  You will need to have someone take you home.  You may feel sick to your stomach (nauseous) or throw up (vomit) if you had general anesthesia.  You may have a sore throat if a tube was placed in your throat during general anesthesia.  You may have light cramping and bleeding for 2 days to 2 weeks after the procedure.  Your uterus needs to make a new lining after the procedure. This may make your next period late. Document Released: 10/20/2005 Document Revised: 01/12/2012 Document Reviewed: 05/18/2009 [facility] Patient Information 2013 [place], [place].

## 2012-10-17 ENCOUNTER — Telehealth: Payer: Self-pay | Admitting: Obstetrics and Gynecology

## 2012-10-17 DIAGNOSIS — Z9889 Other specified postprocedural states: Secondary | ICD-10-CM

## 2012-10-17 DIAGNOSIS — F909 Attention-deficit hyperactivity disorder, unspecified type: Secondary | ICD-10-CM | POA: Clinically undetermined

## 2012-10-17 DIAGNOSIS — F419 Anxiety disorder, unspecified: Secondary | ICD-10-CM

## 2012-10-17 DIAGNOSIS — N12 Tubulo-interstitial nephritis, not specified as acute or chronic: Secondary | ICD-10-CM | POA: Diagnosis not present
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
## 2012-10-17 NOTE — Telephone Encounter (Signed)
TC to patient to inform of time for scheduled D&E tomorrow for missed AB. No bleeding or pain.  Dx by [place] at her [facility] on Friday. Scheduled at 1:30pm with SR.  To be here at 12.  NPO after midnight. Needs someone to take her home, needs to be able to rest tomorrow. Support offered for loss. To call for any questions or concerns before tomorrow.

## 2012-10-18 ENCOUNTER — Encounter (HOSPITAL_COMMUNITY)
Admission: RE | Disposition: A | Discharge status: Home / Self Care | Payer: Self-pay | Source: Ambulatory Visit | Attending: Obstetrics and Gynecology

## 2012-10-18 ENCOUNTER — Ambulatory Visit (HOSPITAL_COMMUNITY)
Admission: RE | Admit: 2012-10-18 | Discharge: 2012-10-18 | Disposition: A | Discharge status: Home / Self Care | Payer: 59 | Source: Ambulatory Visit | Attending: Obstetrics and Gynecology | Admitting: Obstetrics and Gynecology

## 2012-10-18 ENCOUNTER — Encounter (HOSPITAL_COMMUNITY): Payer: Self-pay

## 2012-10-18 ENCOUNTER — Ambulatory Visit (HOSPITAL_COMMUNITY): Payer: 59

## 2012-10-18 ENCOUNTER — Encounter (HOSPITAL_COMMUNITY): Payer: Self-pay | Admitting: *Deleted

## 2012-10-18 DIAGNOSIS — Z9889 Other specified postprocedural states: Secondary | ICD-10-CM

## 2012-10-18 DIAGNOSIS — F419 Anxiety disorder, unspecified: Secondary | ICD-10-CM

## 2012-10-18 DIAGNOSIS — O021 Missed abortion: Secondary | ICD-10-CM

## 2012-10-18 DIAGNOSIS — N12 Tubulo-interstitial nephritis, not specified as acute or chronic: Secondary | ICD-10-CM | POA: Diagnosis not present

## 2012-10-18 DIAGNOSIS — F909 Attention-deficit hyperactivity disorder, unspecified type: Secondary | ICD-10-CM | POA: Clinically undetermined

## 2012-10-18 HISTORY — PX: DILATION AND EVACUATION: SHX1459

## 2012-10-18 LAB — CBC
HCT: 37.4 % (ref 36.0–46.0)
Hemoglobin: 12.8 g/dL (ref 12.0–15.0)
MCH: 32.3 pg (ref 26.0–34.0)
MCHC: 34.2 g/dL (ref 30.0–36.0)
MCV: 94.4 fL (ref 78.0–100.0)
Platelets: 215 10*3/uL (ref 150–400)
RBC: 3.96 MIL/uL (ref 3.87–5.11)
RDW: 12 % (ref 11.5–15.5)
WBC: 8.1 10*3/uL (ref 4.0–10.5)

## 2012-10-18 LAB — PAP IG, CT-NG, RFX HPV ASCU
Chlamydia Probe Amp: NEGATIVE
GC Probe Amp: NEGATIVE

## 2012-10-18 SURGERY — DILATION AND EVACUATION, UTERUS
Anesthesia: Monitor Anesthesia Care | Site: Uterus | Wound class: Clean Contaminated

## 2012-10-18 MED ORDER — MIDAZOLAM HCL 2 MG/2ML IJ SOLN
INTRAMUSCULAR | Status: AC
  Filled 2012-10-18: qty 2

## 2012-10-18 MED ORDER — MEPERIDINE HCL 25 MG/ML IJ SOLN: 6.2500 mg | INTRAMUSCULAR | Status: DC | PRN

## 2012-10-18 MED ORDER — ONDANSETRON HCL 4 MG/2ML IJ SOLN
INTRAMUSCULAR | Status: DC | PRN
  Administered 2012-10-18: 4 mg via INTRAVENOUS

## 2012-10-18 MED ORDER — FENTANYL CITRATE 0.05 MG/ML IJ SOLN
INTRAMUSCULAR | Status: AC
  Filled 2012-10-18: qty 2

## 2012-10-18 MED ORDER — MIDAZOLAM HCL 5 MG/5ML IJ SOLN
INTRAMUSCULAR | Status: DC | PRN
  Administered 2012-10-18: 2 mg via INTRAVENOUS

## 2012-10-18 MED ORDER — DEXAMETHASONE SODIUM PHOSPHATE 10 MG/ML IJ SOLN
INTRAMUSCULAR | Status: AC
  Filled 2012-10-18: qty 1

## 2012-10-18 MED ORDER — FENTANYL CITRATE 0.05 MG/ML IJ SOLN
INTRAMUSCULAR | Status: DC | PRN
  Administered 2012-10-18: 100 ug via INTRAVENOUS

## 2012-10-18 MED ORDER — FENTANYL CITRATE 0.05 MG/ML IJ SOLN: 25.0000 ug | INTRAMUSCULAR | Status: DC | PRN

## 2012-10-18 MED ORDER — LIDOCAINE HCL (CARDIAC) 20 MG/ML IV SOLN
INTRAVENOUS | Status: DC | PRN
  Administered 2012-10-18: 40 mg via INTRAVENOUS

## 2012-10-18 MED ORDER — KETOROLAC TROMETHAMINE 30 MG/ML IJ SOLN
INTRAMUSCULAR | Status: AC
  Filled 2012-10-18: qty 1

## 2012-10-18 MED ORDER — CHLOROPROCAINE HCL 1 % IJ SOLN
INTRAMUSCULAR | Status: AC
  Filled 2012-10-18: qty 30

## 2012-10-18 MED ORDER — DEXAMETHASONE SODIUM PHOSPHATE 10 MG/ML IJ SOLN
INTRAMUSCULAR | Status: DC | PRN
  Administered 2012-10-18: 10 mg via INTRAVENOUS

## 2012-10-18 MED ORDER — KETOROLAC TROMETHAMINE 30 MG/ML IJ SOLN: 15.0000 mg | Freq: Once | INTRAMUSCULAR | Status: DC | PRN

## 2012-10-18 MED ORDER — CHLOROPROCAINE HCL 1 % IJ SOLN
INTRAMUSCULAR | Status: DC | PRN
  Administered 2012-10-18: 20 mL

## 2012-10-18 MED ORDER — KETOROLAC TROMETHAMINE 30 MG/ML IJ SOLN
INTRAMUSCULAR | Status: DC | PRN
  Administered 2012-10-18: 30 mg via INTRAVENOUS

## 2012-10-18 MED ORDER — LACTATED RINGERS IV SOLN
INTRAVENOUS | Status: DC
Start: 2012-10-18 — End: 2012-10-18
  Administered 2012-10-18 (×2): via INTRAVENOUS

## 2012-10-18 MED ORDER — ONDANSETRON HCL 4 MG/2ML IJ SOLN
INTRAMUSCULAR | Status: AC
  Filled 2012-10-18: qty 2

## 2012-10-18 MED ORDER — ONDANSETRON HCL 4 MG/2ML IJ SOLN: 4.0000 mg | Freq: Once | INTRAMUSCULAR | Status: DC | PRN

## 2012-10-18 MED ORDER — IBUPROFEN 600 MG PO TABS: 600.0000 mg | ORAL_TABLET | Freq: Four times a day (QID) | ORAL | Status: DC | PRN

## 2012-10-18 MED ORDER — PROPOFOL 10 MG/ML IV EMUL
INTRAVENOUS | Status: DC | PRN
  Administered 2012-10-18: 200 ug/kg/min via INTRAVENOUS

## 2012-10-18 MED ORDER — LIDOCAINE HCL (CARDIAC) 20 MG/ML IV SOLN
INTRAVENOUS | Status: AC
  Filled 2012-10-18: qty 5

## 2012-10-18 SURGICAL SUPPLY — 17 items
CATH ROBINSON RED A/P 16FR (CATHETERS) ×2 IMPLANT
CLOTH BEACON ORANGE TIMEOUT ST (SAFETY) ×2 IMPLANT
DECANTER SPIKE VIAL GLASS SM (MISCELLANEOUS) ×2 IMPLANT
GLOVE BIOGEL PI IND STRL 7.0 (GLOVE) ×2 IMPLANT
GLOVE BIOGEL PI INDICATOR 7.0 (GLOVE) ×2
GOWN STRL REIN XL XLG (GOWN DISPOSABLE) ×4 IMPLANT
KIT BERKELEY 1ST TRIMESTER 3/8 (MISCELLANEOUS) ×2 IMPLANT
NDL SPNL 22GX3.5 QUINCKE BK (NEEDLE) ×1 IMPLANT
NEEDLE SPNL 22GX3.5 QUINCKE BK (NEEDLE) ×2 IMPLANT
NS IRRIG 1000ML POUR BTL (IV SOLUTION) ×2 IMPLANT
PACK VAGINAL MINOR WOMEN LF (CUSTOM PROCEDURE TRAY) ×2 IMPLANT
PAD OB MATERNITY 4.3X12.25 (PERSONAL CARE ITEMS) ×2 IMPLANT
PAD PREP 24X48 CUFFED NSTRL (MISCELLANEOUS) ×2 IMPLANT
SET BERKELEY SUCTION TUBING (SUCTIONS) ×2 IMPLANT
SYR CONTROL 10ML LL (SYRINGE) ×2 IMPLANT
TOWEL OR 17X24 6PK STRL BLUE (TOWEL DISPOSABLE) ×2 IMPLANT
VACURETTE 9 RIGID CVD (CANNULA) ×1 IMPLANT

## 2012-10-18 NOTE — Anesthesia Postprocedure Evaluation (Signed)
  Anesthesia Post-op Note  Patient: Monica Mahoney  Procedure(s) Performed: Procedure(s) (LRB) with comments: DILATATION AND EVACUATION (N/A)  Patient Location: PACU  Anesthesia Type:MAC  Level of Consciousness: awake, alert  and oriented  Airway and Oxygen Therapy: Patient Spontanous Breathing  Post-op Pain: mild  Post-op Assessment: Post-op Vital signs reviewed, Patient's Cardiovascular Status Stable, Respiratory Function Stable, Patent Airway, No signs of Nausea or vomiting and Pain level controlled  Post-op Vital Signs: Reviewed and stable  Complications: No apparent anesthesia complications

## 2012-10-18 NOTE — Interval H&P Note (Signed)
History and Physical Interval Note:  10/18/2012 5:52 PM  Monica Mahoney  has presented today for surgery, with the diagnosis of Missed Abortion  The various methods of treatment have been discussed with the patient and family. After consideration of risks, benefits and other options for treatment, the patient has consented to  Procedure(s) (LRB) with comments: DILATATION AND EVACUATION (N/A) as a surgical intervention .  The patient's history has been reviewed, patient examined, no change in status, stable for surgery.  I have reviewed the patient's chart and labs.  Questions were answered to the patient's satisfaction.   Will plan TSH, thrombophilia panel and Day 21 Progesterone testing due to 2 consecutive sab Couple requesting genetic testing on POC  Monica Mahoney A

## 2012-10-18 NOTE — Transfer of Care (Signed)
Immediate Anesthesia Transfer of Care Note  Patient: Monica Mahoney  Procedure(s) Performed: Procedure(s) (LRB) with comments: DILATATION AND EVACUATION (N/A)  Patient Location: PACU  Anesthesia Type:MAC  Level of Consciousness: awake  Airway & Oxygen Therapy: Patient Spontanous Breathing  Post-op Assessment: Report given to PACU RN and Post -op Vital signs reviewed and stable  Post vital signs: stable  Complications: No apparent anesthesia complications

## 2012-10-18 NOTE — Progress Notes (Signed)
Pt has been held in [employer] #2 with MAU nurse Peace Gordan Payment, RN and Nicki Reaper, RN in Short Stay seeing MAU patients with Kerrie Buffalo, NP.  Anesthesia and MD have been to see pt.  OR case pending.  Pre Op Care done by Short Stay staff

## 2012-10-18 NOTE — Op Note (Signed)
Preoperative diagnosis: Missed AB  Postoperative diagnosis: Same  Anesthesia: IV sedation and paracervical block  Anesthesiologist: Dr. Malen Gauze  Procedure: Dilatation and evacuation  Surgeon: Dr. Dois Davenport Kais Monje  Estimated blood loss: minimal  Procedure:  After being informed of the planned procedure with possible complications including bleeding, infection and injury to uterus, informed consent is obtained and patient is taken to or #4. She is placed in the lithotomy position and given IV sedation without complication. She is then prepped and draped in a sterile  fashion and her bladder is emptied with an in and out red rubber catheter.  Pelvic exam reveals a anteverted  uterus compatible with [redacted] weeks gestation. Both adnexa are felt and normal.  A weighted speculum is inserted in the vagina and the anterior lip of the cervix was grasped with a tenaculum forcep. We proceed with a paracervical block using 20 cc of Nesacaine 1%. The uterus was then sounded at 8.5 cm. The cervix was easily dilated using Hegar dilator until  #31. Using a #9 curved cannula, we proceed with evacuation of products of conception without difficulty. We then proceed with sharp curettage of the uterine cavity to confirm complete evacuation under ultrasound guidance.  Instruments are then removed. Instrument and sponge count is complete x2.   Estimated blood loss is minimal.  The procedure is very well tolerated by the patient is taken to recovery room in a well and stable condition.  Specimen: Products of conception sent to pathology

## 2012-10-18 NOTE — Anesthesia Preprocedure Evaluation (Signed)
Anesthesia Evaluation  Patient identified by MRN, date of birth, ID band Patient awake    Reviewed: Allergy & Precautions, H&P , NPO status , Patient's Chart, lab work & pertinent test results  Airway Mallampati: I TM Distance: >3 FB Neck ROM: full    Dental No notable dental hx. (+) Teeth Intact   Pulmonary neg pulmonary ROS,    Pulmonary exam normal       Cardiovascular negative cardio ROS      Neuro/Psych PSYCHIATRIC DISORDERS Anxiety negative neurological ROS     GI/Hepatic negative GI ROS, Neg liver ROS,   Endo/Other  negative endocrine ROS  Renal/GU   negative genitourinary   Musculoskeletal negative musculoskeletal ROS (+)   Abdominal Normal abdominal exam  (+)   Peds  Hematology negative hematology ROS (+)   Anesthesia Other Findings   Reproductive/Obstetrics negative OB ROS                           Anesthesia Physical Anesthesia Plan  ASA: II  Anesthesia Plan: MAC   Post-op Pain Management:    Induction: Intravenous  Airway Management Planned:   Additional Equipment:   Intra-op Plan:   Post-operative Plan:   Informed Consent: I have reviewed the patients History and Physical, chart, labs and discussed the procedure including the risks, benefits and alternatives for the proposed anesthesia with the patient or authorized representative who has indicated his/her understanding and acceptance.     Plan Discussed with: CRNA and Surgeon  Anesthesia Plan Comments:         Anesthesia Quick Evaluation

## 2012-10-19 ENCOUNTER — Encounter (HOSPITAL_COMMUNITY): Payer: Self-pay | Admitting: Obstetrics and Gynecology

## 2012-11-04 LAB — CHROMOSOME STD, POC(TISSUE)-NCBH

## 2012-11-08 ENCOUNTER — Encounter: Payer: Self-pay | Admitting: Obstetrics and Gynecology

## 2012-11-08 ENCOUNTER — Ambulatory Visit (INDEPENDENT_AMBULATORY_CARE_PROVIDER_SITE_OTHER): Payer: 59 | Admitting: Obstetrics and Gynecology

## 2012-11-08 VITALS — BP 118/70 | Ht 61.5 in | Wt 133.0 lb

## 2012-11-08 DIAGNOSIS — N96 Recurrent pregnancy loss: Secondary | ICD-10-CM

## 2012-11-08 DIAGNOSIS — O021 Missed abortion: Secondary | ICD-10-CM

## 2012-11-08 NOTE — Progress Notes (Signed)
  Subjective:     Monica Mahoney is a 32 y.o. female who presents for post-op visit.  Surgery: 10/18/12  Date: D&E  Eating a regular diet without difficulty Bowel movements are normal.  The patient is not having any pain.  Bladder function is returned to normal. Vaginal bleeding: none but just stopped 2 days ago  Vaginal discharge: no vaginal discharge   Pathology report was reviewed with patient. Products of Conception - PRODUCTS OF CONCEPTION (FETAL CHORIONIC VILLI IDENTIFIED). - BENIGN DECIDUALIZED ENDOMETRIAL TISSUE PRESENT.  The following portions of the patient's history were reviewed and updated as appropriate: allergies, current medications, past family history, past medical history, past social history, past surgical history and problem list.  Review of Systems Pertinent items are noted in HPI.   Objective:    LMP 07/19/2012 Weight:  Wt Readings from Last 1 Encounters:  10/18/12 135 lb (61.236 kg)    BMI: There is no height or weight on file to calculate BMI.  General Appearance: Alert, appropriate appearance for age. No acute distress Pelvic Exam: Bimanual exam normal  Assessment:    Doing well postoperatively. Operative findings again reviewed. 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

## 2012-12-18 ENCOUNTER — Other Ambulatory Visit: Payer: Self-pay

## 2012-12-21 ENCOUNTER — Telehealth: Payer: Self-pay | Admitting: Obstetrics and Gynecology

## 2012-12-21 ENCOUNTER — Other Ambulatory Visit: Payer: 59

## 2012-12-21 DIAGNOSIS — N96 Recurrent pregnancy loss: Secondary | ICD-10-CM

## 2012-12-21 NOTE — Telephone Encounter (Signed)
Pt calling to advise that she will be coming into office today to have progesterone level check  Darien Ramus, CMA

## 2012-12-21 NOTE — Telephone Encounter (Signed)
sr pt 

## 2012-12-22 ENCOUNTER — Ambulatory Visit (HOSPITAL_COMMUNITY)
Admission: RE | Admit: 2012-12-22 | Discharge: 2012-12-22 | Disposition: A | Discharge status: Home / Self Care | Payer: 59 | Source: Ambulatory Visit | Attending: Family Medicine | Admitting: Family Medicine

## 2012-12-22 ENCOUNTER — Other Ambulatory Visit (HOSPITAL_COMMUNITY): Payer: Self-pay | Admitting: Family Medicine

## 2012-12-22 DIAGNOSIS — M549 Dorsalgia, unspecified: Secondary | ICD-10-CM

## 2012-12-22 DIAGNOSIS — M545 Low back pain, unspecified: Secondary | ICD-10-CM | POA: Insufficient documentation

## 2012-12-22 DIAGNOSIS — K8020 Calculus of gallbladder without cholecystitis without obstruction: Secondary | ICD-10-CM | POA: Insufficient documentation

## 2012-12-22 LAB — THYROID PANEL WITH TSH
Free Thyroxine Index: 2.7 (ref 1.0–3.9)
T3 Uptake: 29.8 % (ref 22.5–37.0)
T4, Total: 9 ug/dL (ref 5.0–12.5)
TSH: 1.396 u[IU]/mL (ref 0.350–4.500)

## 2012-12-22 LAB — PROGESTERONE: Progesterone: 7.6 ng/mL

## 2012-12-23 LAB — HYPERCOAGULABLE PANEL, COMPREHENSIVE
AntiThromb III Func: 131 % — ABNORMAL HIGH (ref 76–126)
Anticardiolipin IgA: 1 APL U/mL (ref <22)
Anticardiolipin IgG: 17 GPL U/mL (ref <23)
Anticardiolipin IgM: 1 MPL U/mL (ref <11)
Beta-2 Glyco I IgG: 0 G Units (ref <20)
Beta-2-Glycoprotein I IgA: 2 A Units (ref <20)
Beta-2-Glycoprotein I IgM: 0 M Units (ref <20)
DRVVT: 29.2 secs (ref <42.9)
Lupus Anticoagulant: NOT DETECTED
PTT Lupus Anticoagulant: 32.2 secs (ref 28.0–43.0)
Protein C Activity: 152 % — ABNORMAL HIGH (ref 75–133)
Protein C, Total: 85 % (ref 72–160)
Protein S Activity: 102 % (ref 69–129)
Protein S Total: 72 % (ref 60–150)

## 2012-12-31 ENCOUNTER — Telehealth: Payer: Self-pay

## 2012-12-31 NOTE — Telephone Encounter (Signed)
Pt advised. Clomid information mailed to pt. Will call with 1st day of cycle to schedule day 1-2-3  Darien Ramus, CMA

## 2012-12-31 NOTE — Telephone Encounter (Signed)
LVM for return call  Kalanie Fewell, CMA  

## 2012-12-31 NOTE — Telephone Encounter (Signed)
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 Send information on Clomid. Offer appointment for Day 1-2-3 if patient is interested in proceeding or to discuss further ------

## 2013-02-10 ENCOUNTER — Telehealth: Payer: Self-pay | Admitting: Oncology

## 2013-02-10 NOTE — Telephone Encounter (Signed)
S/W PT IN RE NP APPT 05/13 @ 10:30 W/DR. SHADAD REFERRING DR. SANDRA RIVARD DX- BLOOD COAG DISORDER WELCOME PACKET MAILED.

## 2013-02-10 NOTE — Telephone Encounter (Signed)
C/D 02/10/13 for appt. 03/15/13

## 2013-02-15 ENCOUNTER — Telehealth: Payer: Self-pay | Admitting: Family Medicine

## 2013-02-15 NOTE — Telephone Encounter (Signed)
Nurse: 1:43PM CB: [phone] for today and tomorrow  Message: Per the patient she is having a flare up of her sciatica. She is requesting a new prescription for prednisone.  Pharm: [facility]

## 2013-02-15 NOTE — Telephone Encounter (Signed)
Med called into [place] [facility]. Pt notified on her voicemail

## 2013-02-15 NOTE — Telephone Encounter (Signed)
pred 20. Three qd for three days, two qd for three days, two qd for 2 days

## 2013-03-04 ENCOUNTER — Other Ambulatory Visit: Payer: Self-pay | Admitting: Oral Surgery

## 2013-03-15 ENCOUNTER — Ambulatory Visit (HOSPITAL_BASED_OUTPATIENT_CLINIC_OR_DEPARTMENT_OTHER): Payer: 59

## 2013-03-15 ENCOUNTER — Encounter: Payer: Self-pay | Admitting: Oncology

## 2013-03-15 ENCOUNTER — Other Ambulatory Visit: Payer: 59 | Admitting: Lab

## 2013-03-15 ENCOUNTER — Ambulatory Visit (HOSPITAL_BASED_OUTPATIENT_CLINIC_OR_DEPARTMENT_OTHER): Payer: 59 | Admitting: Oncology

## 2013-03-15 VITALS — BP 126/82 | HR 79 | Temp 98.3°F | Resp 18 | Ht 61.5 in | Wt 134.7 lb

## 2013-03-15 DIAGNOSIS — D689 Coagulation defect, unspecified: Secondary | ICD-10-CM
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
## 2013-03-15 NOTE — Progress Notes (Signed)
Checked in new patient. No financial issues. °

## 2013-04-14 ENCOUNTER — Telehealth: Payer: Self-pay | Admitting: Family Medicine

## 2013-04-14 MED ORDER — HYDROCODONE-ACETAMINOPHEN 5-325 MG PO TABS
1.0000 | ORAL_TABLET | Freq: Four times a day (QID) | ORAL | Status: DC | PRN
Start: 2013-04-14 — End: 2013-05-23

## 2013-04-14 MED ORDER — ALPRAZOLAM 0.5 MG PO TABS: 0.5000 mg | ORAL_TABLET | Freq: Every day | ORAL | Status: DC | PRN

## 2013-04-14 NOTE — Telephone Encounter (Signed)
Meds faxed to [facility] Patient notified.

## 2013-04-14 NOTE — Telephone Encounter (Signed)
Patient needs refill of hydrocodone sent to [facility].  Also, she is having problems with anxiety again and is wanting to try xanax for PRN instead of Cymbalta that she was on.

## 2013-04-14 NOTE — Telephone Encounter (Signed)
hydrocod 5/325 numb 45 one q 6 pn.  Xanax .5. Numb thirty. No re one qd prn anxiety. Use sparingly. Needs ov before changing this to  A chronic rx

## 2013-04-15 ENCOUNTER — Encounter: Payer: Self-pay | Admitting: *Deleted

## 2013-05-23 ENCOUNTER — Encounter: Payer: Self-pay | Admitting: Family Medicine

## 2013-05-23 ENCOUNTER — Ambulatory Visit (INDEPENDENT_AMBULATORY_CARE_PROVIDER_SITE_OTHER): Payer: 59 | Admitting: Family Medicine

## 2013-05-23 ENCOUNTER — Telehealth: Payer: Self-pay | Admitting: *Deleted

## 2013-05-23 VITALS — BP 142/88 | HR 70 | Wt 130.0 lb

## 2013-05-23 DIAGNOSIS — F909 Attention-deficit hyperactivity disorder, unspecified type: Secondary | ICD-10-CM

## 2013-05-23 DIAGNOSIS — F411 Generalized anxiety disorder: Secondary | ICD-10-CM

## 2013-05-23 DIAGNOSIS — F419 Anxiety disorder, unspecified: Secondary | ICD-10-CM

## 2013-05-23 DIAGNOSIS — M549 Dorsalgia, unspecified: Secondary | ICD-10-CM

## 2013-05-23 DIAGNOSIS — G8929 Other chronic pain: Secondary | ICD-10-CM

## 2013-05-23 MED ORDER — ALPRAZOLAM 0.5 MG PO TABS: 0.5000 mg | ORAL_TABLET | Freq: Every day | ORAL | Status: DC | PRN

## 2013-05-23 MED ORDER — DULOXETINE HCL 30 MG PO CPEP: 30.0000 mg | ORAL_CAPSULE | Freq: Every day | ORAL | Status: DC

## 2013-05-23 MED ORDER — HYDROCODONE-ACETAMINOPHEN 5-325 MG PO TABS: 1.0000 | ORAL_TABLET | Freq: Four times a day (QID) | ORAL | Status: DC | PRN

## 2013-05-23 NOTE — Telephone Encounter (Signed)
Ok write out three more I'll sign

## 2013-05-23 NOTE — Progress Notes (Signed)
  Subjective:    Patient ID: Monica Mahoney, female    DOB: 09/17/1981, 32 y.o.   MRN: [id number]  HPI Patient arrives office for a phenomenal number of concerns. She has a history of ADHD. She is compliant with the medication for this. Feels recently it is not working as well. Though she admits that maybe related to next problem.  Patient has considerable anxiety with an element of depression. We have started Xanax at her request. Admits to a lot of stress , in many ways related to the following 2 problems. No suicidal thoughts. Some difficulty sleeping.  Patient reports her back pain is worsening. Primarily lumbar region. Radiates well down into left leg. She takes daily narcotics for this. Getting worse. Wonders if she may need a shot.  Patient has been having difficulty with getting pregnant. Currently on injections. Has had difficulties with miscarriages and inability to get pregnant. Somewhat afraid to start Cymbalta because she knows she'll have to stop if she gets pregnant   Review of Systems No weight loss or weight gain no chest pain no nausea no abdominal pain ROS otherwise negative    Objective:   Physical Exam  Alert no acute distress. Somewhat tearful during discussion. HEENT normal. Lungs clear. Heart regular rate and rhythm. Low back tender to percussion. Negative straight leg raise at this time.      Assessment & Plan:  Impression 1 ADHD discussed at length. #2 chronic anxiety with element of depression discussed at length. Patient thinks she would also benefit from counseling. #3 chronic back pain patient wonders about injection. On further history he went learning this would require an MRI before approval patient decided to hold off for now. Plan reinitiate Cymbalta 30 mg daily. Maintain Xanax to use daily. Maintain same Adderall dose. Appropriate counseling. 35 minutes spent most in discussion. WSL recheck in several months.

## 2013-05-23 NOTE — Telephone Encounter (Signed)
In office today but forgot to get refills on adderall xr 20 mg. Takes one a day. She has a script for this month. Call when ready to pick up.

## 2013-05-24 NOTE — Telephone Encounter (Signed)
RX's upfront ready for pickup. Left message on voicemail notifying patient.

## 2013-06-15 ENCOUNTER — Telehealth: Payer: Self-pay | Admitting: Family Medicine

## 2013-06-15 MED ORDER — PREDNISONE 20 MG PO TABS: ORAL_TABLET | ORAL | Status: DC

## 2013-06-15 NOTE — Telephone Encounter (Signed)
pred 20mg  3qd3d 2qd3d 1qd2d

## 2013-06-15 NOTE — Telephone Encounter (Signed)
Patient would like a prescription for Prednisone for her back.  States she is in pain with her back.  [facility]  Please call Patient. Thanks

## 2013-06-15 NOTE — Telephone Encounter (Signed)
Sent in pred 20mg  3qd3d 2qd3d 1qd2d to [facility]. Patient was notified.

## 2013-06-30 ENCOUNTER — Telehealth: Payer: Self-pay | Admitting: Family Medicine

## 2013-06-30 ENCOUNTER — Other Ambulatory Visit: Payer: Self-pay | Admitting: Family Medicine

## 2013-06-30 NOTE — Telephone Encounter (Signed)
Patient needs Rx for vicodin to [facility]

## 2013-06-30 NOTE — Telephone Encounter (Signed)
Ok times one wis 

## 2013-06-30 NOTE — Telephone Encounter (Signed)
Left message on voicemail notifying patient that at last office visit 5 additional refills was put on this medication. Advised to contact pharmacy.

## 2013-07-13 DIAGNOSIS — Z0289 Encounter for other administrative examinations: Secondary | ICD-10-CM

## 2013-08-10 ENCOUNTER — Telehealth: Payer: Self-pay | Admitting: Family Medicine

## 2013-08-10 ENCOUNTER — Other Ambulatory Visit: Payer: Self-pay | Admitting: *Deleted

## 2013-08-10 MED ORDER — HYDROCODONE-ACETAMINOPHEN 5-325 MG PO TABS: 1.0000 | ORAL_TABLET | Freq: Four times a day (QID) | ORAL | Status: DC | PRN

## 2013-08-10 MED ORDER — AMPHETAMINE-DEXTROAMPHET ER 20 MG PO CP24: 20.0000 mg | ORAL_CAPSULE | ORAL | Status: DC

## 2013-08-10 NOTE — Telephone Encounter (Signed)
Ok both times one, ov before further, tellher rules regarding hydrocodone have changed tremendously, by fed law need to see every three mo

## 2013-08-10 NOTE — Telephone Encounter (Signed)
Patient states she needs a refill on the following medications:  amphetamine-dextroamphetamine (ADDERALL XR) 20 MG 24 hr capsule HYDROcodone-acetaminophen (NORCO/VICODIN) 5-325 MG per tablet  Please call Patient. Thanks

## 2013-08-10 NOTE — Telephone Encounter (Signed)
Discussed with pt on voicemail. Scripts are ready for pick up

## 2013-08-17 ENCOUNTER — Encounter: Payer: Self-pay | Admitting: Family Medicine

## 2013-08-17 ENCOUNTER — Ambulatory Visit (INDEPENDENT_AMBULATORY_CARE_PROVIDER_SITE_OTHER): Payer: 59 | Admitting: Family Medicine

## 2013-08-17 VITALS — BP 134/80 | Ht 61.5 in | Wt 136.2 lb

## 2013-08-17 DIAGNOSIS — F419 Anxiety disorder, unspecified: Secondary | ICD-10-CM

## 2013-08-17 DIAGNOSIS — F411 Generalized anxiety disorder: Secondary | ICD-10-CM

## 2013-08-17 DIAGNOSIS — M549 Dorsalgia, unspecified: Secondary | ICD-10-CM

## 2013-08-17 DIAGNOSIS — M5432 Sciatica, left side: Secondary | ICD-10-CM

## 2013-08-17 DIAGNOSIS — F909 Attention-deficit hyperactivity disorder, unspecified type: Secondary | ICD-10-CM

## 2013-08-17 DIAGNOSIS — G8929 Other chronic pain: Secondary | ICD-10-CM

## 2013-08-17 DIAGNOSIS — M5430 Sciatica, unspecified side: Secondary | ICD-10-CM

## 2013-08-17 MED ORDER — AMPHETAMINE-DEXTROAMPHET ER 30 MG PO CP24: 30.0000 mg | ORAL_CAPSULE | ORAL | Status: DC

## 2013-08-17 MED ORDER — HYDROCODONE-ACETAMINOPHEN 5-325 MG PO TABS: 1.0000 | ORAL_TABLET | Freq: Four times a day (QID) | ORAL | Status: DC | PRN

## 2013-08-17 NOTE — Progress Notes (Signed)
  Subjective:    Patient ID: Monica Mahoney, female    DOB: 09-20-1981, 32 y.o.   MRN: [id number]  HPI Patient is here today for 3 month check up. Patient has several current significant concerns  She needs a refill on Adderall and Hydrocodone.   Still sig back pain. With weathere change more flare up. Ongoing pain. Severe pain at times down the left leg accompanied by numbness into the foot. An occasional sensation of weakness.  Frustrated by weight gain.tires out quickly when trying to run.  Ongoing challenge with focusing and attention, no sig change in focusing. By lunch feeling zoned out. Feels that the current dose of Adderall XR is not enough.  Emotionally feeling better, feels the cymbalta has helped. Mood improved. counselling with the miscarriage is helping. States definitely helping  Still using xanax regularly, also helping without significant side effects. .     Review of Systems No chest pain no vomiting no change in bowel habits ROS otherwise negative    Objective:   Physical Exam Alert clearly frustrated. Vital stable. HEENT normal. Lungs clear. Heart regular in rhythm. Left leg positive straight leg raise. Positive sciatic notch tenderness. Low back tender to percussion. Strength and reflexes intact but some diminished sensation lateral foot.  Mental status patient reports her mood is better less depression. Less anxiety. Feels Cymbalta has definitely helped in this regard.  No focal neurological deficits.      Assessment & Plan:  Impression #1 ADHD worsening discuss at length. #2 depression somewhat improved. #3 chronic back pain secondary to remote trauma. Along with severe neuropathic component which unfortunately is worsening. Patient would like to see if injections may help. Discussed at length. #4 anxiety overall improved. Hydrocodone use new DEA regulations discussed at length. Plan 35 minutes spent most in discussion. Increase Adderall XR to 30 mg daily.  390 day prescriptions of hydrocodone written. MRI low back rationale discussed. We'll make consultation once those results are available. WSL

## 2013-08-23 ENCOUNTER — Ambulatory Visit (HOSPITAL_COMMUNITY)
Admission: RE | Admit: 2013-08-23 | Discharge: 2013-08-23 | Disposition: A | Discharge status: Home / Self Care | Payer: 59 | Source: Ambulatory Visit | Attending: Family Medicine | Admitting: Family Medicine

## 2013-08-23 ENCOUNTER — Encounter (HOSPITAL_COMMUNITY): Payer: Self-pay

## 2013-08-23 DIAGNOSIS — M545 Low back pain, unspecified: Secondary | ICD-10-CM | POA: Insufficient documentation

## 2013-08-23 DIAGNOSIS — M8448XA Pathological fracture, other site, initial encounter for fracture: Secondary | ICD-10-CM | POA: Insufficient documentation

## 2013-08-23 DIAGNOSIS — M5432 Sciatica, left side: Secondary | ICD-10-CM

## 2013-08-23 DIAGNOSIS — M5126 Other intervertebral disc displacement, lumbar region: Secondary | ICD-10-CM | POA: Insufficient documentation

## 2013-09-08 ENCOUNTER — Other Ambulatory Visit: Payer: Self-pay

## 2013-11-01 ENCOUNTER — Telehealth: Payer: Self-pay | Admitting: Family Medicine

## 2013-11-10 ENCOUNTER — Ambulatory Visit: Payer: 59 | Admitting: Family Medicine

## 2013-11-15 ENCOUNTER — Encounter: Payer: Self-pay | Admitting: Family Medicine

## 2013-11-15 ENCOUNTER — Ambulatory Visit (INDEPENDENT_AMBULATORY_CARE_PROVIDER_SITE_OTHER): Payer: 59 | Admitting: Family Medicine

## 2013-11-15 ENCOUNTER — Telehealth: Payer: Self-pay | Admitting: Family Medicine

## 2013-11-15 VITALS — BP 122/82 | Ht 61.5 in | Wt 138.8 lb

## 2013-11-15 DIAGNOSIS — M549 Dorsalgia, unspecified: Secondary | ICD-10-CM

## 2013-11-15 DIAGNOSIS — E785 Hyperlipidemia, unspecified: Secondary | ICD-10-CM

## 2013-11-15 DIAGNOSIS — F419 Anxiety disorder, unspecified: Secondary | ICD-10-CM

## 2013-11-15 DIAGNOSIS — R5383 Other fatigue: Secondary | ICD-10-CM

## 2013-11-15 DIAGNOSIS — Z79899 Other long term (current) drug therapy: Secondary | ICD-10-CM

## 2013-11-15 DIAGNOSIS — F411 Generalized anxiety disorder: Secondary | ICD-10-CM

## 2013-11-15 DIAGNOSIS — I872 Venous insufficiency (chronic) (peripheral): Secondary | ICD-10-CM

## 2013-11-15 DIAGNOSIS — F909 Attention-deficit hyperactivity disorder, unspecified type: Secondary | ICD-10-CM

## 2013-11-15 DIAGNOSIS — G8929 Other chronic pain: Secondary | ICD-10-CM

## 2013-11-15 DIAGNOSIS — R5381 Other malaise: Secondary | ICD-10-CM

## 2013-11-15 DIAGNOSIS — I878 Other specified disorders of veins: Secondary | ICD-10-CM

## 2013-11-15 MED ORDER — ALPRAZOLAM 0.5 MG PO TABS: 0.5000 mg | ORAL_TABLET | Freq: Every day | ORAL | Status: DC | PRN

## 2013-11-15 MED ORDER — HYDROCODONE-ACETAMINOPHEN 5-325 MG PO TABS: 1.0000 | ORAL_TABLET | Freq: Four times a day (QID) | ORAL | Status: DC | PRN

## 2013-11-15 MED ORDER — AMPHETAMINE-DEXTROAMPHET ER 30 MG PO CP24: 30.0000 mg | ORAL_CAPSULE | ORAL | Status: DC

## 2013-11-15 MED ORDER — DULOXETINE HCL 30 MG PO CPEP: 30.0000 mg | ORAL_CAPSULE | Freq: Every day | ORAL | Status: DC

## 2013-11-15 NOTE — Telephone Encounter (Signed)
Lip liv M7 tsh

## 2013-11-15 NOTE — Telephone Encounter (Signed)
Medication sent to pharmacy. Patient is requesting to have complete bloodwork done.

## 2013-11-15 NOTE — Telephone Encounter (Signed)
NTSW. "Complete bw" pt didn't mention, what kind of concerns does she have???

## 2013-11-15 NOTE — Telephone Encounter (Signed)
Left message on voicemail notifying patient blood work was ordered and can report to lab.

## 2013-11-15 NOTE — Progress Notes (Signed)
   Subjective:    Patient ID: Monica Mahoney, female    DOB: 07-01-1981, 33 y.o.   MRN: [id number]  HPI Patient arrives to follow up on ADHD and meds.pt was dr critzer. First injec coming up shortly, thought that inflam was the pmain prob. And now getting the injections.  Ongoing sciatica pain a lot of discomfort towards the end of the day.  Still using the hydrocod,, pain med is okay.  atient reports holding on to fluid. Hardly ever goes to the bathroom, ankles are swollen  Then can pee a lot when not working.  Focusing has improved, doing better, thirty seems to help. Now better access,  Exercise not so good over the holidays, not the best the past nonbth ir si,   Pt has some degen changes already via scan, no problem that would necessitate actual neurosurgery  Review of Systems No chest pain no abdominal pain no change in bowel habits no blood in stool no rash ROS otherwise negative    Objective:   Physical Exam Alert somewhat at bedtime appearing. Vitals review. HEENT normal. Lungs clear. Heart regular in rhythm. Low back tender to palpation plus minus straight leg raise. Ankles trace edema currently pulses good sensation good.       Assessment & Plan:  Impression 1 ADHD improved control. Discussed. #2 chronic anxiety ongoing. #3 reflux clinically stable. #4 depression clinically stable. #5 chronic pain ongoing. Needs medication definitely. Hopes that shots will help. #6 venous stasis discussed. Plan all meds reviewed and refilled. Recheck in several months diet exercise discussed in encourage. Patient requested blood work after visit will do per request. WSL

## 2013-11-15 NOTE — Telephone Encounter (Signed)
Patient states that she forgot to ask to have this ordered at her last office visit. States it has been a while since she had blood work done and she wants the regular routine lab work that you have to fast for.

## 2013-11-15 NOTE — Telephone Encounter (Signed)
Patient needs Rx for [facility]ymbalta [facility]

## 2013-11-17 DIAGNOSIS — I878 Other specified disorders of veins: Secondary | ICD-10-CM | POA: Insufficient documentation

## 2013-12-01 LAB — HEPATIC FUNCTION PANEL
ALT: 13 U/L (ref 0–35)
AST: 10 U/L (ref 0–37)
Albumin: 4.3 g/dL (ref 3.5–5.2)
Alkaline Phosphatase: 45 U/L (ref 39–117)
Bilirubin, Direct: 0.3 mg/dL (ref 0.0–0.3)
Indirect Bilirubin: 1.4 mg/dL — ABNORMAL HIGH (ref 0.2–1.2)
Total Bilirubin: 1.7 mg/dL — ABNORMAL HIGH (ref 0.2–1.2)
Total Protein: 6.7 g/dL (ref 6.0–8.3)

## 2013-12-01 LAB — BASIC METABOLIC PANEL
BUN: 20 mg/dL (ref 6–23)
CO2: 32 mEq/L (ref 19–32)
Calcium: 9.2 mg/dL (ref 8.4–10.5)
Chloride: 100 mEq/L (ref 96–112)
Creat: 0.75 mg/dL (ref 0.50–1.10)
Glucose, Bld: 74 mg/dL (ref 70–99)
Potassium: 4.3 mEq/L (ref 3.5–5.3)
Sodium: 138 mEq/L (ref 135–145)

## 2013-12-01 LAB — TSH: TSH: 2.21 u[IU]/mL (ref 0.350–4.500)

## 2013-12-01 LAB — LIPID PANEL
Cholesterol: 130 mg/dL (ref 0–200)
HDL: 48 mg/dL (ref >39)
LDL Cholesterol: 68 mg/dL (ref 0–99)
Total CHOL/HDL Ratio: 2.7 Ratio
Triglycerides: 72 mg/dL (ref <150)
VLDL: 14 mg/dL (ref 0–40)

## 2013-12-11 ENCOUNTER — Encounter: Payer: Self-pay | Admitting: Family Medicine

## 2014-02-14 ENCOUNTER — Encounter: Payer: Self-pay | Admitting: Family Medicine

## 2014-02-14 ENCOUNTER — Ambulatory Visit (INDEPENDENT_AMBULATORY_CARE_PROVIDER_SITE_OTHER): Payer: 59 | Admitting: Family Medicine

## 2014-02-14 VITALS — BP 122/86 | Ht 62.0 in | Wt 134.0 lb

## 2014-02-14 DIAGNOSIS — N920 Excessive and frequent menstruation with regular cycle: Secondary | ICD-10-CM

## 2014-02-14 DIAGNOSIS — I872 Venous insufficiency (chronic) (peripheral): Secondary | ICD-10-CM

## 2014-02-14 DIAGNOSIS — I878 Other specified disorders of veins: Secondary | ICD-10-CM

## 2014-02-14 DIAGNOSIS — T148XXA Other injury of unspecified body region, initial encounter: Secondary | ICD-10-CM

## 2014-02-14 DIAGNOSIS — M549 Dorsalgia, unspecified: Secondary | ICD-10-CM

## 2014-02-14 DIAGNOSIS — F909 Attention-deficit hyperactivity disorder, unspecified type: Secondary | ICD-10-CM

## 2014-02-14 DIAGNOSIS — G8929 Other chronic pain: Secondary | ICD-10-CM

## 2014-02-14 DIAGNOSIS — K219 Gastro-esophageal reflux disease without esophagitis: Secondary | ICD-10-CM | POA: Insufficient documentation

## 2014-02-14 LAB — POCT HEMOGLOBIN: Hemoglobin: 13.6 g/dL (ref 12.2–16.2)

## 2014-02-14 MED ORDER — CETIRIZINE HCL 10 MG PO TABS: 10.0000 mg | ORAL_TABLET | Freq: Every day | ORAL | Status: DC

## 2014-02-14 MED ORDER — HYDROCODONE-ACETAMINOPHEN 5-325 MG PO TABS: 1.0000 | ORAL_TABLET | Freq: Four times a day (QID) | ORAL | Status: DC | PRN

## 2014-02-14 MED ORDER — AMPHETAMINE-DEXTROAMPHET ER 30 MG PO CP24: 30.0000 mg | ORAL_CAPSULE | ORAL | Status: DC

## 2014-02-14 MED ORDER — PANTOPRAZOLE SODIUM 40 MG PO TBEC: 40.0000 mg | DELAYED_RELEASE_TABLET | Freq: Every day | ORAL | Status: DC

## 2014-02-14 MED ORDER — DULOXETINE HCL 30 MG PO CPEP: 30.0000 mg | ORAL_CAPSULE | Freq: Every day | ORAL | Status: DC

## 2014-02-14 MED ORDER — ALPRAZOLAM 0.5 MG PO TABS: 0.5000 mg | ORAL_TABLET | Freq: Every day | ORAL | Status: DC | PRN

## 2014-02-14 NOTE — Progress Notes (Signed)
   Subjective:    Patient ID: Monica Mahoney, female    DOB: [birth year], 33 y.o.   MRN: [id number]  HPIADD check up.   Discuss changing omeprazole to prontonix. Protonix is free at [facility] protonix covered 100 per cent, takes daily and definitely helps  Ex is better, has lost 4 pounds  Bruising a lot for the past month. Heavy periods for the past two months. Would like iron level checked. Hb today is 13.6. A bit concerned about this.  Injections have helped the pain considerably, and much improved post two injec's. Still using the pain medication Monica Mahoney's.  Ongoing challenges with anxiety and depression. However states both the Cymbalta and the anxiolytic is helping.  Claims fairly good compliance with diet at this point.   Pain re-emerging and injecs have helped.  Results for orders placed in visit on 02/14/14  POCT HEMOGLOBIN      Result Value Ref Range   Hemoglobin 13.6  12.2 - 16.2 g/dL       Review of Systems No headache no chest pain no abdominal pain some chronic neck pain. Radiates into left leg at times. No change in urinary or bowel habits ROS otherwise negative    Objective:   Physical Exam Alert hydration good. HEENT normal. Lungs clear. Heart regular in rhythm. Some low back pain to percussion. Plus minus straight leg raise on left.       Assessment & Plan:  Impression 1 chronic sciatica improved with injections still needs pain meds discussed. #2 depression anxiety clinically stable. #3 ADHD ongoing definitely needs medicines and states that for now. #4 reflux stable when on meds. #5 easy bruisability. With heavier periods. Plan all medications refilled. Diet exercise discussed. CBC of easy bruisability persists. We'll need to followup with GYN regarding menstrual cycles. WSL

## 2014-03-20 ENCOUNTER — Ambulatory Visit (HOSPITAL_COMMUNITY): Payer: 59 | Admitting: Psychology

## 2014-04-11 ENCOUNTER — Ambulatory Visit (INDEPENDENT_AMBULATORY_CARE_PROVIDER_SITE_OTHER): Payer: 59 | Admitting: Psychology

## 2014-04-11 DIAGNOSIS — F411 Generalized anxiety disorder: Secondary | ICD-10-CM

## 2014-05-02 ENCOUNTER — Ambulatory Visit (INDEPENDENT_AMBULATORY_CARE_PROVIDER_SITE_OTHER): Payer: 59 | Admitting: Psychology

## 2014-05-02 DIAGNOSIS — F411 Generalized anxiety disorder: Secondary | ICD-10-CM

## 2014-05-25 ENCOUNTER — Encounter: Payer: Self-pay | Admitting: Nurse Practitioner

## 2014-05-25 ENCOUNTER — Ambulatory Visit (INDEPENDENT_AMBULATORY_CARE_PROVIDER_SITE_OTHER): Payer: 59 | Admitting: Nurse Practitioner

## 2014-05-25 VITALS — BP 124/80 | Ht 61.5 in | Wt 139.0 lb

## 2014-05-25 DIAGNOSIS — G8929 Other chronic pain: Secondary | ICD-10-CM

## 2014-05-25 DIAGNOSIS — F419 Anxiety disorder, unspecified: Secondary | ICD-10-CM

## 2014-05-25 DIAGNOSIS — M549 Dorsalgia, unspecified: Secondary | ICD-10-CM

## 2014-05-25 DIAGNOSIS — F909 Attention-deficit hyperactivity disorder, unspecified type: Secondary | ICD-10-CM

## 2014-05-25 DIAGNOSIS — F411 Generalized anxiety disorder: Secondary | ICD-10-CM

## 2014-05-25 DIAGNOSIS — K219 Gastro-esophageal reflux disease without esophagitis: Secondary | ICD-10-CM

## 2014-05-25 MED ORDER — HYDROCODONE-ACETAMINOPHEN 5-325 MG PO TABS: 1.0000 | ORAL_TABLET | Freq: Four times a day (QID) | ORAL | Status: DC | PRN

## 2014-05-25 MED ORDER — AMPHETAMINE-DEXTROAMPHETAMINE 10 MG PO TABS: ORAL_TABLET | ORAL | Status: DC

## 2014-05-25 MED ORDER — AMPHETAMINE-DEXTROAMPHET ER 30 MG PO CP24: 30.0000 mg | ORAL_CAPSULE | ORAL | Status: DC

## 2014-05-25 MED ORDER — DULOXETINE HCL 60 MG PO CPEP: 60.0000 mg | ORAL_CAPSULE | Freq: Every day | ORAL | Status: DC

## 2014-05-29 ENCOUNTER — Encounter: Payer: Self-pay | Admitting: Nurse Practitioner

## 2014-05-29 NOTE — Progress Notes (Signed)
Subjective:  Presents for recheck. Has chronic back pain. Takes pain med mainly at night which helps her rest. occas during the day only with severe pain. Goes to [facility] where she receives injections which help. Pain gradually increases over time before next injections are due. Would like to increase cymbalta to see if this will help pain and increased anxiety. Has increased her xanax use slightly. adderall working well for 8 hours but wears off before her 12 hour shift is complete.  Objective:   BP 124/80  Ht 5' 1.5" (1.562 m)  Wt 139 lb (63.05 kg)  BMI 25.84 kg/m2  LMP 05/07/2014 NAD. Alert, oriented. Lungs clear. Heart RRR. abd soft, nontender.  Assessment: Chronic back pain  Attention deficit hyperactivity disorder (ADHD), unspecified ADHD type  Anxiety  Gastroesophageal reflux disease without esophagitis  Plan:  Meds ordered this encounter  Medications  . DISCONTD: HYDROcodone-acetaminophen (NORCO/VICODIN) 5-325 MG per tablet    Sig: Take 1 tablet by mouth every 6 (six) hours as needed.    Dispense:  45 tablet    Refill:  0    May fill 30 days from 05/25/14    Order Specific Question:  Supervising Provider    Answer:  Merlyn AlbertLUKING, WILLIAM S [[id number]]  . DISCONTD: HYDROcodone-acetaminophen (NORCO/VICODIN) 5-325 MG per tablet    Sig: Take 1 tablet by mouth every 6 (six) hours as needed.    Dispense:  45 tablet    Refill:  0    May fill 60 days from 05/25/14    Order Specific Question:  Supervising Provider    Answer:  Merlyn AlbertLUKING, WILLIAM S [[id number]]  . HYDROcodone-acetaminophen (NORCO/VICODIN) 5-325 MG per tablet    Sig: Take 1 tablet by mouth every 6 (six) hours as needed.    Dispense:  45 tablet    Refill:  0    Order Specific Question:  Supervising Provider    Answer:  Merlyn AlbertLUKING, WILLIAM S [[id number]]  . DULoxetine (CYMBALTA) 60 MG capsule    Sig: Take 1 capsule (60 mg total) by mouth daily.    Dispense:  90 capsule    Refill:  1    Order Specific Question:  Supervising  Provider    Answer:  Merlyn AlbertLUKING, WILLIAM S [[id number]]  . amphetamine-dextroamphetamine (ADDERALL XR) 30 MG 24 hr capsule    Sig: Take 1 capsule (30 mg total) by mouth every morning.    Dispense:  90 capsule    Refill:  0  . amphetamine-dextroamphetamine (ADDERALL) 10 MG tablet    Sig: Take 1 tablet po in the afternoon    Dispense:  90 tablet    Refill:  0   Increase cymbalta to 60 mg. Stop med and call if any problems. Add adderall 10 mg about 8 hours after morning dose. Do not take too late in the afternoon. Given 90 day refill by Dr. Lorin PicketScott. Also, reminded patient that many of these drugs should not be taken during pregnancy. Has been advised of this by GYN. Return in about 3 months (around 08/25/2014).

## 2014-06-06 ENCOUNTER — Encounter (HOSPITAL_COMMUNITY): Payer: Self-pay | Admitting: Psychology

## 2014-06-06 ENCOUNTER — Ambulatory Visit (HOSPITAL_COMMUNITY): Payer: Self-pay | Admitting: Psychology
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
## 2014-07-05 ENCOUNTER — Ambulatory Visit (INDEPENDENT_AMBULATORY_CARE_PROVIDER_SITE_OTHER): Payer: 59 | Admitting: Psychology

## 2014-07-05 ENCOUNTER — Encounter (HOSPITAL_COMMUNITY): Payer: Self-pay | Admitting: Psychology

## 2014-07-05 DIAGNOSIS — F411 Generalized anxiety disorder: Secondary | ICD-10-CM
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
## 2014-08-02 ENCOUNTER — Ambulatory Visit (INDEPENDENT_AMBULATORY_CARE_PROVIDER_SITE_OTHER): Payer: 59 | Admitting: Psychology

## 2014-08-02 ENCOUNTER — Encounter (HOSPITAL_COMMUNITY): Payer: Self-pay | Admitting: Psychology

## 2014-08-02 DIAGNOSIS — F411 Generalized anxiety disorder: Secondary | ICD-10-CM
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
## 2014-08-24 ENCOUNTER — Ambulatory Visit (HOSPITAL_COMMUNITY): Payer: Self-pay | Admitting: Psychology

## 2014-08-29 ENCOUNTER — Ambulatory Visit: Payer: 59 | Admitting: Nurse Practitioner

## 2014-09-04 ENCOUNTER — Encounter (HOSPITAL_COMMUNITY): Payer: Self-pay | Admitting: Psychology

## 2014-09-07 ENCOUNTER — Ambulatory Visit (INDEPENDENT_AMBULATORY_CARE_PROVIDER_SITE_OTHER): Payer: 59 | Admitting: Nurse Practitioner

## 2014-09-07 ENCOUNTER — Encounter: Payer: Self-pay | Admitting: Nurse Practitioner

## 2014-09-07 VITALS — BP 120/78 | Ht 61.5 in | Wt 149.8 lb

## 2014-09-07 DIAGNOSIS — G8929 Other chronic pain: Secondary | ICD-10-CM

## 2014-09-07 DIAGNOSIS — K219 Gastro-esophageal reflux disease without esophagitis: Secondary | ICD-10-CM

## 2014-09-07 DIAGNOSIS — M549 Dorsalgia, unspecified: Secondary | ICD-10-CM

## 2014-09-07 DIAGNOSIS — F900 Attention-deficit hyperactivity disorder, predominantly inattentive type: Secondary | ICD-10-CM

## 2014-09-07 MED ORDER — ALPRAZOLAM 0.5 MG PO TABS: 0.5000 mg | ORAL_TABLET | Freq: Every day | ORAL | Status: DC | PRN

## 2014-09-07 MED ORDER — DULOXETINE HCL 60 MG PO CPEP: 60.0000 mg | ORAL_CAPSULE | Freq: Every day | ORAL | Status: DC

## 2014-09-07 MED ORDER — AMPHETAMINE-DEXTROAMPHETAMINE 10 MG PO TABS: ORAL_TABLET | ORAL | Status: DC

## 2014-09-07 MED ORDER — HYDROCODONE-ACETAMINOPHEN 5-325 MG PO TABS: 1.0000 | ORAL_TABLET | Freq: Four times a day (QID) | ORAL | Status: DC | PRN

## 2014-09-07 MED ORDER — AMPHETAMINE-DEXTROAMPHET ER 30 MG PO CP24: 30.0000 mg | ORAL_CAPSULE | ORAL | Status: DC

## 2014-09-07 MED ORDER — PANTOPRAZOLE SODIUM 40 MG PO TBEC: 40.0000 mg | DELAYED_RELEASE_TABLET | Freq: Every day | ORAL | Status: DC

## 2014-09-08 ENCOUNTER — Encounter: Payer: Self-pay | Admitting: Nurse Practitioner

## 2014-09-08 NOTE — Progress Notes (Signed)
Subjective:  Presents for routine follow-up. Taking her hydrocodone, not every day only with severe pain, depends on her work schedule. Injections in her back given in April have lasted, greatly helped her pain. Ran out of Protonix about a week ago, has noticed a flareup of her reflux symptoms. Well-controlled before this. Increased dose of Cymbalta has helped her anxiety. Less Xanax use. 30 pills this last year well over a month. Gets regular preventive health physicals. Current Adderall regimen is controlling her ADHD symptoms.  Objective:   BP 120/78 mmHg  Ht 5' 1.5" (1.562 m)  Wt 149 lb 12.8 oz (67.949 kg)  BMI 27.85 kg/m2 NAD. Alert, oriented. Lungs clear. Heart regular rate rhythm. Abdomen soft nondistended nontender.  Assessment:  Problem List Items Addressed This Visit      Digestive   Esophageal reflux   Relevant Medications      pantoprazole (PROTONIX) EC tablet     Other   ADHD (attention deficit hyperactivity disorder) - Primary   Chronic back pain   Relevant Medications      HYDROcodone-acetaminophen (NORCO/VICODIN) 5-325 MG per tablet     Plan:  Meds ordered this encounter  Medications  . ALPRAZolam (XANAX) 0.5 MG tablet    Sig: Take 1 tablet (0.5 mg total) by mouth daily as needed for sleep or anxiety.    Dispense:  30 tablet    Refill:  0    May fill monthly    Order Specific Question:  Supervising Provider    Answer:  Merlyn AlbertLUKING, WILLIAM S [[id number]]  . DULoxetine (CYMBALTA) 60 MG capsule    Sig: Take 1 capsule (60 mg total) by mouth daily.    Dispense:  90 capsule    Refill:  1    Order Specific Question:  Supervising Provider    Answer:  Merlyn AlbertLUKING, WILLIAM S [[id number]]  . DISCONTD: HYDROcodone-acetaminophen (NORCO/VICODIN) 5-325 MG per tablet    Sig: Take 1 tablet by mouth every 6 (six) hours as needed.    Dispense:  45 tablet    Refill:  0    Order Specific Question:  Supervising Provider    Answer:  Merlyn AlbertLUKING, WILLIAM S [[id number]]  . pantoprazole (PROTONIX) 40 MG tablet     Sig: Take 1 tablet (40 mg total) by mouth daily.    Dispense:  90 tablet    Refill:  1    Order Specific Question:  Supervising Provider    Answer:  Merlyn AlbertLUKING, WILLIAM S [[id number]]  . DISCONTD: HYDROcodone-acetaminophen (NORCO/VICODIN) 5-325 MG per tablet    Sig: Take 1 tablet by mouth every 6 (six) hours as needed.    Dispense:  45 tablet    Refill:  0    May fill 30 days from 09/07/14    Order Specific Question:  Supervising Provider    Answer:  Merlyn AlbertLUKING, WILLIAM S [[id number]]  . HYDROcodone-acetaminophen (NORCO/VICODIN) 5-325 MG per tablet    Sig: Take 1 tablet by mouth every 6 (six) hours as needed.    Dispense:  45 tablet    Refill:  0    May fill 60 days from 09/07/14    Order Specific Question:  Supervising Provider    Answer:  Merlyn AlbertLUKING, WILLIAM S [[id number]]  . progesterone (PROMETRIUM) 200 MG capsule    Sig:     Refill:  1  . letrozole (FEMARA) 2.5 MG tablet    Sig:     Refill:  0  . amphetamine-dextroamphetamine (ADDERALL) 10 MG tablet    Sig: Take 1 tablet  po in the afternoon    Dispense:  90 tablet    Refill:  0  . amphetamine-dextroamphetamine (ADDERALL XR) 30 MG 24 hr capsule    Sig: Take 1 capsule (30 mg total) by mouth every morning.    Dispense:  90 capsule    Refill:  0  given 90 day prescriptions for her Adderall per Dr. Lorin PicketScott. Continue current regimen. Return in about 3 months (around 12/08/2014).

## 2014-09-13 LAB — OB RESULTS CONSOLE HIV ANTIBODY (ROUTINE TESTING): HIV: NONREACTIVE

## 2014-09-13 LAB — OB RESULTS CONSOLE ANTIBODY SCREEN: Antibody Screen: NEGATIVE

## 2014-09-13 LAB — OB RESULTS CONSOLE GC/CHLAMYDIA
Chlamydia: NEGATIVE
Gonorrhea: NEGATIVE

## 2014-09-13 LAB — OB RESULTS CONSOLE HEPATITIS B SURFACE ANTIGEN: Hepatitis B Surface Ag: NEGATIVE

## 2014-09-13 LAB — OB RESULTS CONSOLE ABO/RH: RH Type: POSITIVE

## 2014-09-13 LAB — OB RESULTS CONSOLE RUBELLA ANTIBODY, IGM: Rubella: IMMUNE

## 2014-09-13 LAB — OB RESULTS CONSOLE RPR: RPR: NONREACTIVE

## 2014-11-03 NOTE — L&D Delivery Note (Signed)
Operative Delivery Note At 5:50 PM a viable and healthy female was delivered via Vaginal, Vacuum Investment banker, operational).  Presentation: vertex; Position: Left,, Occiput,, Anterior; Station: +4.  Verbal consent: obtained from patient.  Risks and benefits discussed in detail.  Risks include, but are not limited to the risks of anesthesia, bleeding, infection, damage to maternal tissues, fetal cephalhematoma.  There is also the risk of inability to effect vaginal delivery of the head, or shoulder dystocia that cannot be resolved by established maneuvers, leading to the need for emergency cesarean section. Maternal exhaustion and pt requests assistance.  APGAR: 7, 9; weight  .   Placenta status: Intact, Manual removal.   Cord: 3 vessels with the following complications: None.  Cord pH: pending  Anesthesia: Epidural  Instruments: Kiwi x 2 pulls, no pop offs Episiotomy: Median due to poor maternal expulsive efforts, fetal bradycardia and thick meconium Lacerations: None Suture Repair: 2.0 vicryl rapide Est. Blood Loss (mL):  300  Mom to postpartum.  Baby to Couplet care / Skin to Skin.  Jayani Rozman J 04/17/2015, 6:09 PM

## 2015-03-02 ENCOUNTER — Other Ambulatory Visit: Payer: Self-pay | Admitting: Nurse Practitioner

## 2015-03-22 LAB — OB RESULTS CONSOLE GBS: GBS: POSITIVE

## 2015-04-13 ENCOUNTER — Encounter (HOSPITAL_COMMUNITY): Payer: Self-pay | Admitting: *Deleted

## 2015-04-13 ENCOUNTER — Telehealth (HOSPITAL_COMMUNITY): Payer: Self-pay | Admitting: *Deleted

## 2015-04-13 NOTE — Telephone Encounter (Signed)
Preadmission screen  

## 2015-04-17 ENCOUNTER — Encounter (HOSPITAL_COMMUNITY): Payer: Self-pay

## 2015-04-17 ENCOUNTER — Inpatient Hospital Stay (HOSPITAL_COMMUNITY): Payer: 59 | Admitting: Anesthesiology

## 2015-04-17 ENCOUNTER — Inpatient Hospital Stay (HOSPITAL_COMMUNITY)
Admission: RE | Admit: 2015-04-17 | Discharge: 2015-04-18 | DRG: 775 | Disposition: A | Discharge status: Home / Self Care | Payer: 59 | Source: Ambulatory Visit | Attending: Obstetrics and Gynecology | Admitting: Obstetrics and Gynecology

## 2015-04-17 VITALS — BP 123/59 | HR 67 | Temp 97.5°F | Resp 18 | Ht 61.0 in | Wt 180.0 lb

## 2015-04-17 DIAGNOSIS — Z8759 Personal history of other complications of pregnancy, childbirth and the puerperium: Secondary | ICD-10-CM

## 2015-04-17 DIAGNOSIS — Z3483 Encounter for supervision of other normal pregnancy, third trimester: Secondary | ICD-10-CM | POA: Diagnosis present

## 2015-04-17 DIAGNOSIS — O99824 Streptococcus B carrier state complicating childbirth: Secondary | ICD-10-CM | POA: Diagnosis present

## 2015-04-17 DIAGNOSIS — F419 Anxiety disorder, unspecified: Secondary | ICD-10-CM

## 2015-04-17 DIAGNOSIS — O3663X0 Maternal care for excessive fetal growth, third trimester, not applicable or unspecified: Secondary | ICD-10-CM | POA: Diagnosis present

## 2015-04-17 DIAGNOSIS — Z3A39 39 weeks gestation of pregnancy: Secondary | ICD-10-CM | POA: Diagnosis present

## 2015-04-17 DIAGNOSIS — F329 Major depressive disorder, single episode, unspecified: Secondary | ICD-10-CM | POA: Diagnosis present

## 2015-04-17 DIAGNOSIS — F32A Depression, unspecified: Secondary | ICD-10-CM | POA: Diagnosis present

## 2015-04-17 HISTORY — DX: Major depressive disorder, single episode, unspecified: F32.9

## 2015-04-17 HISTORY — DX: Anxiety disorder, unspecified: F41.9

## 2015-04-17 HISTORY — DX: Personal history of other complications of pregnancy, childbirth and the puerperium: Z87.59

## 2015-04-17 LAB — ABO/RH: ABO/RH(D): A POS

## 2015-04-17 LAB — CBC
HCT: 34.1 % — ABNORMAL LOW (ref 36.0–46.0)
Hemoglobin: 12.1 g/dL (ref 12.0–15.0)
MCH: 33.4 pg (ref 26.0–34.0)
MCHC: 35.5 g/dL (ref 30.0–36.0)
MCV: 94.2 fL (ref 78.0–100.0)
Platelets: 255 10*3/uL (ref 150–400)
RBC: 3.62 MIL/uL — ABNORMAL LOW (ref 3.87–5.11)
RDW: 13.8 % (ref 11.5–15.5)
WBC: 13.2 10*3/uL — ABNORMAL HIGH (ref 4.0–10.5)

## 2015-04-17 LAB — TYPE AND SCREEN
ABO/RH(D): A POS
Antibody Screen: NEGATIVE

## 2015-04-17 LAB — RPR: RPR Ser Ql: NONREACTIVE

## 2015-04-17 MED ORDER — PRENATAL MULTIVITAMIN CH
1.0000 | ORAL_TABLET | Freq: Every day | ORAL | Status: DC
  Administered 2015-04-18: 1 via ORAL
  Filled 2015-04-17: qty 1

## 2015-04-17 MED ORDER — METHYLERGONOVINE MALEATE 0.2 MG/ML IJ SOLN: 0.2000 mg | INTRAMUSCULAR | Status: DC | PRN

## 2015-04-17 MED ORDER — DIPHENHYDRAMINE HCL 50 MG/ML IJ SOLN: 12.5000 mg | INTRAMUSCULAR | Status: DC | PRN

## 2015-04-17 MED ORDER — LIDOCAINE HCL (PF) 1 % IJ SOLN
INTRAMUSCULAR | Status: DC | PRN
  Administered 2015-04-17 (×4): 4 mL

## 2015-04-17 MED ORDER — DULOXETINE HCL 30 MG PO CPEP
30.0000 mg | ORAL_CAPSULE | Freq: Every day | ORAL | Status: DC
  Administered 2015-04-18: 30 mg via ORAL
  Filled 2015-04-17 (×2): qty 1

## 2015-04-17 MED ORDER — OXYCODONE-ACETAMINOPHEN 5-325 MG PO TABS: 1.0000 | ORAL_TABLET | ORAL | Status: DC | PRN

## 2015-04-17 MED ORDER — ACETAMINOPHEN 325 MG PO TABS: 650.0000 mg | ORAL_TABLET | ORAL | Status: DC | PRN

## 2015-04-17 MED ORDER — ONDANSETRON HCL 4 MG/2ML IJ SOLN
4.0000 mg | Freq: Four times a day (QID) | INTRAMUSCULAR | Status: DC | PRN
  Administered 2015-04-17: 4 mg via INTRAVENOUS
  Filled 2015-04-17: qty 2

## 2015-04-17 MED ORDER — OXYTOCIN 40 UNITS IN LACTATED RINGERS INFUSION - SIMPLE MED: 62.5000 mL/h | INTRAVENOUS | Status: DC

## 2015-04-17 MED ORDER — EPHEDRINE 5 MG/ML INJ
10.0000 mg | INTRAVENOUS | Status: DC | PRN
  Filled 2015-04-17: qty 2

## 2015-04-17 MED ORDER — ZOLPIDEM TARTRATE 5 MG PO TABS: 5.0000 mg | ORAL_TABLET | Freq: Every evening | ORAL | Status: DC | PRN

## 2015-04-17 MED ORDER — BENZOCAINE-MENTHOL 20-0.5 % EX AERO
1.0000 "application " | INHALATION_SPRAY | CUTANEOUS | Status: DC | PRN
  Administered 2015-04-17: 1 via TOPICAL
  Filled 2015-04-17 (×2): qty 56

## 2015-04-17 MED ORDER — PHENYLEPHRINE 40 MCG/ML (10ML) SYRINGE FOR IV PUSH (FOR BLOOD PRESSURE SUPPORT)
80.0000 ug | PREFILLED_SYRINGE | INTRAVENOUS | Status: DC | PRN
  Filled 2015-04-17: qty 2
  Filled 2015-04-17: qty 20

## 2015-04-17 MED ORDER — OXYCODONE-ACETAMINOPHEN 5-325 MG PO TABS: 2.0000 | ORAL_TABLET | ORAL | Status: DC | PRN

## 2015-04-17 MED ORDER — PENICILLIN G POTASSIUM 5000000 UNITS IJ SOLR
2.5000 10*6.[IU] | INTRAVENOUS | Status: DC
  Administered 2015-04-17 (×2): 2.5 10*6.[IU] via INTRAVENOUS
  Filled 2015-04-17 (×5): qty 2.5

## 2015-04-17 MED ORDER — DIBUCAINE 1 % RE OINT: 1.0000 "application " | TOPICAL_OINTMENT | RECTAL | Status: DC | PRN

## 2015-04-17 MED ORDER — TERBUTALINE SULFATE 1 MG/ML IJ SOLN
0.2500 mg | Freq: Once | INTRAMUSCULAR | Status: DC | PRN
  Filled 2015-04-17: qty 1

## 2015-04-17 MED ORDER — PENICILLIN G POTASSIUM 5000000 UNITS IJ SOLR
5.0000 10*6.[IU] | Freq: Once | INTRAVENOUS | Status: AC
  Administered 2015-04-17: 5 10*6.[IU] via INTRAVENOUS
  Filled 2015-04-17: qty 5

## 2015-04-17 MED ORDER — LANOLIN HYDROUS EX OINT: TOPICAL_OINTMENT | CUTANEOUS | Status: DC | PRN

## 2015-04-17 MED ORDER — WITCH HAZEL-GLYCERIN EX PADS: 1.0000 "application " | MEDICATED_PAD | CUTANEOUS | Status: DC | PRN

## 2015-04-17 MED ORDER — FENTANYL 2.5 MCG/ML BUPIVACAINE 1/10 % EPIDURAL INFUSION (WH - ANES)
14.0000 mL/h | INTRAMUSCULAR | Status: DC | PRN
  Administered 2015-04-17: 14 mL/h via EPIDURAL
  Administered 2015-04-17: 10 mL/h via EPIDURAL
  Administered 2015-04-17: 14 mL/h via EPIDURAL
  Filled 2015-04-17 (×2): qty 125

## 2015-04-17 MED ORDER — BUPIVACAINE HCL (PF) 0.25 % IJ SOLN
INTRAMUSCULAR | Status: DC | PRN
  Administered 2015-04-17: 5 mL

## 2015-04-17 MED ORDER — SENNOSIDES-DOCUSATE SODIUM 8.6-50 MG PO TABS
2.0000 | ORAL_TABLET | ORAL | Status: DC
  Administered 2015-04-17: 2 via ORAL
  Filled 2015-04-17: qty 2

## 2015-04-17 MED ORDER — ACETAMINOPHEN 325 MG PO TABS
650.0000 mg | ORAL_TABLET | ORAL | Status: DC | PRN
  Administered 2015-04-18: 650 mg via ORAL
  Filled 2015-04-17: qty 2

## 2015-04-17 MED ORDER — ONDANSETRON HCL 4 MG PO TABS: 4.0000 mg | ORAL_TABLET | ORAL | Status: DC | PRN

## 2015-04-17 MED ORDER — TETANUS-DIPHTH-ACELL PERTUSSIS 5-2.5-18.5 LF-MCG/0.5 IM SUSP: 0.5000 mL | Freq: Once | INTRAMUSCULAR | Status: DC

## 2015-04-17 MED ORDER — OXYTOCIN 40 UNITS IN LACTATED RINGERS INFUSION - SIMPLE MED
1.0000 m[IU]/min | INTRAVENOUS | Status: DC
  Administered 2015-04-17: 2 m[IU]/min via INTRAVENOUS
  Filled 2015-04-17: qty 1000

## 2015-04-17 MED ORDER — OXYTOCIN BOLUS FROM INFUSION
500.0000 mL | INTRAVENOUS | Status: DC
  Administered 2015-04-17: 500 mL via INTRAVENOUS

## 2015-04-17 MED ORDER — FLEET ENEMA 7-19 GM/118ML RE ENEM: 1.0000 | ENEMA | Freq: Every day | RECTAL | Status: DC | PRN

## 2015-04-17 MED ORDER — OXYCODONE-ACETAMINOPHEN 5-325 MG PO TABS
1.0000 | ORAL_TABLET | ORAL | Status: DC | PRN
  Administered 2015-04-18: 1 via ORAL
  Filled 2015-04-17: qty 1

## 2015-04-17 MED ORDER — LACTATED RINGERS IV SOLN
INTRAVENOUS | Status: DC
  Administered 2015-04-17 (×2): via INTRAVENOUS

## 2015-04-17 MED ORDER — DIPHENHYDRAMINE HCL 25 MG PO CAPS: 25.0000 mg | ORAL_CAPSULE | Freq: Four times a day (QID) | ORAL | Status: DC | PRN

## 2015-04-17 MED ORDER — SIMETHICONE 80 MG PO CHEW: 80.0000 mg | CHEWABLE_TABLET | ORAL | Status: DC | PRN

## 2015-04-17 MED ORDER — CITRIC ACID-SODIUM CITRATE 334-500 MG/5ML PO SOLN: 30.0000 mL | ORAL | Status: DC | PRN

## 2015-04-17 MED ORDER — METHYLERGONOVINE MALEATE 0.2 MG PO TABS: 0.2000 mg | ORAL_TABLET | ORAL | Status: DC | PRN

## 2015-04-17 MED ORDER — LIDOCAINE HCL (PF) 1 % IJ SOLN
30.0000 mL | INTRAMUSCULAR | Status: DC | PRN
  Filled 2015-04-17: qty 30

## 2015-04-17 MED ORDER — IBUPROFEN 600 MG PO TABS
600.0000 mg | ORAL_TABLET | Freq: Four times a day (QID) | ORAL | Status: DC
  Administered 2015-04-17 – 2015-04-18 (×4): 600 mg via ORAL
  Filled 2015-04-17 (×4): qty 1

## 2015-04-17 MED ORDER — LACTATED RINGERS IV SOLN: 500.0000 mL | INTRAVENOUS | Status: DC | PRN

## 2015-04-17 MED ORDER — ONDANSETRON HCL 4 MG/2ML IJ SOLN: 4.0000 mg | INTRAMUSCULAR | Status: DC | PRN

## 2015-04-17 MED ORDER — SODIUM BICARBONATE 8.4 % IV SOLN
INTRAVENOUS | Status: DC | PRN
  Administered 2015-04-17 (×3): 5 mL via EPIDURAL

## 2015-04-17 NOTE — Progress Notes (Signed)
Monica Mahoney is a 34 y.o. G3P1011 at [redacted]w[redacted]d by LMP admitted for active labor  Subjective: Right sided pain with epidural  Objective: BP 115/64 mmHg  Pulse 102  Temp(Src) 98 F (36.7 C) (Oral)  Resp 20  Ht 5\' 1"  (1.549 m)  Wt 81.647 kg (180 lb)  BMI 34.03 kg/m2  SpO2 97%  LMP 07/21/2014      FHT:  FHR: 125 bpm, variability: moderate,  accelerations:  Present,  decelerations:  Absent UC:   regular, every 2-4 minutes SVE:   Dilation: 7 Effacement (%): 90 Station: -1 Exam by:: Dr. Billy Coast  Labs: Lab Results  Component Value Date   WBC 13.2* 04/17/2015   HGB 12.1 04/17/2015   HCT 34.1* 04/17/2015   MCV 94.2 04/17/2015   PLT 255 04/17/2015    Assessment / Plan: Augmentation of labor, progressing well  Labor: Progressing normally Preeclampsia:  no signs or symptoms of toxicity Fetal Wellbeing:  Category I Pain Control:  Epidural I/D:  n/a Anticipated MOD:  NSVD  Monica Mahoney J 04/17/2015, 1:32 PM
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
## 2015-04-17 NOTE — Anesthesia Procedure Notes (Addendum)
Epidural Patient location during procedure: OB  Staffing Anesthesiologist: Adelin Ventrella Performed by: anesthesiologist   Preanesthetic Checklist Completed: patient identified, site marked, surgical consent, pre-op evaluation, timeout performed, IV checked, risks and benefits discussed and monitors and equipment checked  Epidural Patient position: sitting Prep: site prepped and draped and DuraPrep Patient monitoring: continuous pulse ox and blood pressure Approach: midline Location: L3-L4 Injection technique: LOR saline  Needle:  Needle type: Tuohy  Needle gauge: 17 G Needle length: 9 cm and 9 Needle insertion depth: 7 cm Catheter type: closed end flexible Catheter size: 19 Gauge Catheter at skin depth: 12 cm Test dose: negative  Assessment Events: blood not aspirated, injection not painful, no injection resistance, negative IV test and no paresthesia  Additional Notes Patient identified. Risks/Benefits/Options discussed with patient including but not limited to bleeding, infection, nerve damage, paralysis, failed block, incomplete pain control, headache, blood pressure changes, nausea, vomiting, reactions to medication both or allergic, itching and postpartum back pain. Confirmed with bedside nurse the patient's most recent platelet count. Confirmed with patient that they are not currently taking any anticoagulation, have any bleeding history or any family history of bleeding disorders. Patient expressed understanding and wished to proceed. All questions were answered. Sterile technique was used throughout the entire procedure. Please see nursing notes for vital signs. Test dose was given through epidural catheter and negative prior to continuing to dose epidural or start infusion. Warning signs of high block given to the patient including shortness of breath, tingling/numbness in hands, complete motor block, or any concerning symptoms with instructions to call for help. Patient was  given instructions on fall risk and not to get out of bed. All questions and concerns addressed with instructions to call with any issues or inadequate analgesia.     Epidural Patient location during procedure: OB Start time: 04/17/2015 10:10 AM  Staffing Anesthesiologist: Karie Schwalbe Performed by: anesthesiologist   Preanesthetic Checklist Completed: patient identified, site marked, surgical consent, pre-op evaluation, timeout performed, IV checked, risks and benefits discussed and monitors and equipment checked  Epidural Patient position: sitting Prep: site prepped and draped and DuraPrep Patient monitoring: continuous pulse ox and blood pressure Approach: midline Location: L3-L4 Injection technique: LOR saline  Needle:  Needle type: Tuohy  Needle gauge: 17 G Needle length: 9 cm and 9 Needle insertion depth: 7 cm Catheter type: closed end flexible Catheter size: 19 Gauge Catheter at skin depth: 11 cm Test dose: negative  Assessment Events: blood not aspirated, injection not painful, no injection resistance, negative IV test and no paresthesia  Additional Notes Patient identified. Risks/Benefits/Options discussed with patient including but not limited to bleeding, infection, nerve damage, paralysis, failed block, incomplete pain control, headache, blood pressure changes, nausea, vomiting, reactions to medication both or allergic, itching and postpartum back pain. Confirmed with bedside nurse the patient's most recent platelet count. Confirmed with patient that they are not currently taking any anticoagulation, have any bleeding history or any family history of bleeding disorders. Patient expressed understanding and wished to proceed. All questions were answered. Sterile technique was used throughout the entire procedure. Please see nursing notes for vital signs. Test dose was given through epidural catheter and negative prior to continuing to dose epidural or start infusion.  Warning signs of high block given to the patient including shortness of breath, tingling/numbness in hands, complete motor block, or any concerning symptoms with instructions to call for help. Patient was given instructions on fall risk and not to get out of bed. All questions  and concerns addressed with instructions to call with any issues or inadequate analgesia.

## 2015-04-18 ENCOUNTER — Encounter (HOSPITAL_COMMUNITY): Payer: Self-pay

## 2015-04-18 DIAGNOSIS — F419 Anxiety disorder, unspecified: Secondary | ICD-10-CM

## 2015-04-18 DIAGNOSIS — F329 Major depressive disorder, single episode, unspecified: Secondary | ICD-10-CM | POA: Diagnosis present

## 2015-04-18 DIAGNOSIS — Z8759 Personal history of other complications of pregnancy, childbirth and the puerperium: Secondary | ICD-10-CM

## 2015-04-18 DIAGNOSIS — F32A Depression, unspecified: Secondary | ICD-10-CM

## 2015-04-18 HISTORY — DX: Depression, unspecified: F32.A

## 2015-04-18 HISTORY — DX: Personal history of other complications of pregnancy, childbirth and the puerperium: Z87.59

## 2015-04-18 HISTORY — DX: Anxiety disorder, unspecified: F41.9

## 2015-04-18 LAB — CBC
HCT: 29.3 % — ABNORMAL LOW (ref 36.0–46.0)
Hemoglobin: 10.3 g/dL — ABNORMAL LOW (ref 12.0–15.0)
MCH: 33.3 pg (ref 26.0–34.0)
MCHC: 35.2 g/dL (ref 30.0–36.0)
MCV: 94.8 fL (ref 78.0–100.0)
Platelets: 220 10*3/uL (ref 150–400)
RBC: 3.09 MIL/uL — ABNORMAL LOW (ref 3.87–5.11)
RDW: 13.8 % (ref 11.5–15.5)
WBC: 19 10*3/uL — ABNORMAL HIGH (ref 4.0–10.5)

## 2015-04-18 MED ORDER — IBUPROFEN 600 MG PO TABS: 600.0000 mg | ORAL_TABLET | Freq: Four times a day (QID) | ORAL | Status: DC

## 2015-04-18 NOTE — Discharge Instructions (Signed)
Nutrition for the New Mother  °A new mother needs good health and nutrition so she can have energy to take care of a new baby. Whether a mother breastfeeds or formula feeds the baby, it is important to have a well-balanced diet. Foods from all the food groups should be chosen to meet the new mother's energy needs and to give her the nutrients needed for repair and healing.  °A HEALTHY EATING PLAN °The My [employer] plan for Moms outlines what you should eat to help you and your baby stay healthy. The energy and amount of food you need depends on whether or not you are breastfeeding. If you are breastfeeding you will need more nutrients. If you choose not to breastfeed, your nutrition goal should be to return to a healthy weight. Limiting calories may be needed if you are not breastfeeding.  °HOME CARE INSTRUCTIONS  °· For a personal plan based on your unique needs, see your Registered Dietitian or visit www.mypyramid.gov. °· Eat a variety of foods. The plan below will help guide you. The following chart has a suggested daily meal plan from the [employer] for Moms. °· Eat a variety of fruits and vegetables. °· Eat more dark green and orange vegetables and cooked dried beans. °· Make half your grains whole grains. Choose whole instead of refined grains. °· Choose low-fat or lean meats and poultry. °· Choose low-fat or fat-free dairy products like milk, cheese, or yogurt. °Fruits °· Breastfeeding: 2 cups °· Non-Breastfeeding: 2 cups °· What Counts as a serving? °¨ 1 cup of fruit or juice. °¨ ½ cup dried fruit. °Vegetables °· Breastfeeding: 3 cups °· Non-Breastfeeding: 2 ½ cups °· What Counts as a serving? °¨ 1 cup raw or cooked vegetables. °¨ Juice or 2 cups raw leafy vegetables. °Grains °· Breastfeeding: 8 oz °· Non-Breastfeeding: 6 oz °· What Counts as a serving? °¨ 1 slice bread. °¨ 1 oz ready-to-eat cereal. °¨ ½ cup cooked pasta, rice, or cereal. °Meat and Beans °· Breastfeeding: 6 ½ oz °· Non-Breastfeeding: 5 ½  oz °· What Counts as a serving? °¨ 1 oz lean meat, poultry, or fish °¨ ¼ cup cooked dry beans °¨ ½ oz nuts or 1 egg °¨ 1 tbs peanut butter °Milk °· Breastfeeding: 3 cups °· Non-Breastfeeding: 3 cups °· What Counts as a serving? °¨ 1 cup milk. °¨ 8 oz yogurt. °¨ 1 ½ oz cheese. °¨ 2 oz processed cheese. °TIPS FOR THE BREASTFEEDING MOM °· Rapid weight loss is not suggested when you are breastfeeding. By simply breastfeeding, you will be able to lose the weight gained during your pregnancy. Your caregiver can keep track of your weight and tell you if your weight loss is appropriate. °· Be sure to drink fluids. You may notice that you are thirstier than usual. A suggestion is to drink a glass of water or other beverage whenever you breastfeed. °· Avoid alcohol as it can be passed into your breast milk. °· Limit caffeine drinks to no more than 2 to 3 cups per day. °· You may need to keep taking your prenatal vitamin while you are breastfeeding. Talk with your caregiver about taking a vitamin or supplement. °RETURING TO A HEALTHY WEIGHT °· The My [employer] Plan for Moms will help you return to a healthy weight. It will also provide the nutrients you need. °· You may need to limit "empty" calories. These include: °¨ High fat foods like fried foods, fatty meats, fast food, butter, and mayonnaise. °¨ High   sugar foods like sodas, jelly, candy, and sweets. °· Be physically active. Include 30 minutes of exercise or more each day. Choose an activity you like such as walking, swimming, biking, or aerobics. Check with your caregiver before you start to exercise. °Document Released: 01/27/2008 Document Revised: 01/12/2012 Document Reviewed: 01/27/2008 °ExitCare® Patient Information ©2015 [employer]. This information is not intended to replace advice given to you by your health care provider. Make sure you discuss any questions you have with your health care provider. °Postpartum Depression and Baby Blues °The postpartum period  begins right after the birth of a baby. During this time, there is often a great amount of joy and excitement. It is also a time of many changes in the life of the parents. Regardless of how many times a mother gives birth, each child brings new challenges and dynamics to the family. It is not unusual to have feelings of excitement along with confusing shifts in moods, emotions, and thoughts. All mothers are at risk of developing postpartum depression or the "baby blues." These mood changes can occur right after giving birth, or they may occur many months after giving birth. The baby blues or postpartum depression can be mild or severe. Additionally, postpartum depression can go away rather quickly, or it can be a long-term condition.  °CAUSES °Raised hormone levels and the rapid drop in those levels are thought to be a main cause of postpartum depression and the baby blues. A number of hormones change during and after pregnancy. Estrogen and progesterone usually decrease right after the delivery of your baby. The levels of thyroid hormone and various cortisol steroids also rapidly drop. Other factors that play a role in these mood changes include major life events and genetics.  °RISK FACTORS °If you have any of the following risks for the baby blues or postpartum depression, know what symptoms to watch out for during the postpartum period. Risk factors that may increase the likelihood of getting the baby blues or postpartum depression include: °· Having a personal or family history of depression.   °· Having depression while being pregnant.   °· Having premenstrual mood issues or mood issues related to oral contraceptives. °· Having a lot of life stress.   °· Having marital conflict.   °· Lacking a social support network.   °· Having a baby with special needs.   °· Having health problems, such as diabetes.   °SIGNS AND SYMPTOMS °Symptoms of baby blues include: °· Brief changes in mood, such as going from extreme  happiness to sadness. °· Decreased concentration.   °· Difficulty sleeping.   °· Crying spells, tearfulness.   °· Irritability.   °· Anxiety.   °Symptoms of postpartum depression typically begin within the first month after giving birth. These symptoms include: °· Difficulty sleeping or excessive sleepiness.   °· Marked weight loss.   °· Agitation.   °· Feelings of worthlessness.   °· Lack of interest in activity or food.   °Postpartum psychosis is a very serious condition and can be dangerous. Fortunately, it is rare. Displaying any of the following symptoms is cause for immediate medical attention. Symptoms of postpartum psychosis include:  °· Hallucinations and delusions.   °· Bizarre or disorganized behavior.   °· Confusion or disorientation.   °DIAGNOSIS  °A diagnosis is made by an evaluation of your symptoms. There are no medical or lab tests that lead to a diagnosis, but there are various questionnaires that a health care provider may use to identify those with the baby blues, postpartum depression, or psychosis. Often, a screening tool called the Edinburgh Postnatal Depression   Scale is used to diagnose depression in the postpartum period.  °TREATMENT °The baby blues usually goes away on its own in 1-2 weeks. Social support is often all that is needed. You will be encouraged to get adequate sleep and rest. Occasionally, you may be given medicines to help you sleep.  °Postpartum depression requires treatment because it can last several months or longer if it is not treated. Treatment may include individual or group therapy, medicine, or both to address any social, physiological, and psychological factors that may play a role in the depression. Regular exercise, a healthy diet, rest, and social support may also be strongly recommended.  °Postpartum psychosis is more serious and needs treatment right away. Hospitalization is often needed. °HOME CARE INSTRUCTIONS °· Get as much rest as you can. Nap when the baby  sleeps.   °· Exercise regularly. Some women find yoga and walking to be beneficial.   °· Eat a balanced and nourishing diet.   °· Do little things that you enjoy. Have a cup of tea, take a bubble bath, read your favorite magazine, or listen to your favorite music. °· Avoid alcohol.   °· Ask for help with household chores, cooking, grocery shopping, or running errands as needed. Do not try to do everything.   °· Talk to people close to you about how you are feeling. Get support from your partner, family members, friends, or other new moms. °· Try to stay positive in how you think. Think about the things you are grateful for.   °· Do not spend a lot of time alone.   °· Only take over-the-counter or prescription medicine as directed by your health care provider. °· Keep all your postpartum appointments.   °· Let your health care provider know if you have any concerns.   °SEEK MEDICAL CARE IF: °You are having a reaction to or problems with your medicine. °SEEK IMMEDIATE MEDICAL CARE IF: °· You have suicidal feelings.   °· You think you may harm the baby or someone else. °MAKE SURE YOU: °· Understand these instructions. °· Will watch your condition. °· Will get help right away if you are not doing well or get worse. °Document Released: 07/24/2004 Document Revised: 10/25/2013 Document Reviewed: 08/01/2013 °ExitCare® Patient Information ©2015 [employer]. This information is not intended to replace advice given to you by your health care provider. Make sure you discuss any questions you have with your health care provider. °Breast Pumping Tips °If you are breastfeeding, there may be times when you cannot feed your baby directly. Returning to work or going on a trip are common examples. Pumping allows you to store breast milk and feed it to your baby later.  °You may not get much milk when you first start to pump. Your breasts should start to make more after a few days. If you pump at the times you usually feed your baby,  you may be able to keep making enough milk to feed your baby without also using formula. The more often you pump, the more milk you will produce.  °WHEN SHOULD I PUMP?  °· You can begin to pump soon after delivery. However, some experts recommend waiting about 4 weeks before giving your infant a bottle to make sure breastfeeding is going well.  °· If you plan to return to work, begin pumping a few weeks before. This will help you develop techniques that work best for you. It also lets you build up a supply of breast milk.   °· When you are with your infant, feed on demand and   pump after each feeding.   °· When you are away from your infant for several hours, pump for about 15 minutes every 2-3 hours. Pump both breasts at the same time if you can.   °· If your infant has a formula feeding, make sure to pump around the same time.     °· If you drink any alcohol, wait 2 hours before pumping.   °HOW DO I PREPARE TO PUMP? °Your let-down reflex is the natural reaction to stimulation that makes your breast milk flow. It is easier to stimulate this reflex when you are relaxed. Find relaxation techniques that work for you. If you have difficulty with your let-down reflex, try these methods:  °· Smell one of your infant's blankets or an item of clothing.   °· Look at a picture or video of your infant.   °· Sit in a quiet, private space.   °· Massage the breast you plan to pump.   °· Place soothing warmth on the breast.   °· Play relaxing music.   °WHAT ARE SOME GENERAL BREAST PUMPING TIPS? °· Wash your hands before you pump. You do not need to wash your nipples or breasts. °· There are three ways to pump. °¨ You can use your hand to massage and compress your breast. °¨ You can use a handheld manual pump. °¨ You can use an electric pump.   °· Make sure the suction cup (flange) on the breast pump is the right size. Place the flange directly over the nipple. If it is the wrong size or placed the wrong way, it may be painful and  cause nipple damage.   °· If pumping is uncomfortable, apply a small amount of purified or modified lanolin to your nipple and areola. °· If you are using an electric pump, adjust the speed and suction power to be more comfortable. °· If pumping is painful or if you are not getting very much milk, you may need a different type of pump. A lactation consultant can help you determine what type of pump to use.   °· Keep a full water bottle near you at all times. Drinking lots of fluid helps you make more milk.  °· You can store your milk to use later. Pumped breast milk can be stored in a sealable, sterile container or plastic bag. Label all stored breast milk with the date you pumped it. °¨ Milk can stay out at room temperature for up to 8 hours. °¨ You can store your milk in the refrigerator for up to 8 days. °¨ You can store your milk in the freezer for 3 months. Thaw frozen milk using warm water. Do not put it in the microwave. °· Do not smoke. Smoking can lower your milk supply and harm your infant. If you need help quitting, ask your health care provider to recommend a program.   °WHEN SHOULD I CALL MY HEALTH CARE PROVIDER OR A LACTATION CONSULTANT? °· You are having trouble pumping. °· You are concerned that you are not making enough milk. °· You have nipple pain, soreness, or redness. °· You want to use birth control. Birth control pills may lower your milk supply. Talk to your health care provider about your options. °Document Released: 04/09/2010 Document Revised: 10/25/2013 Document Reviewed: 08/12/2013 °Exit[employer]® Patient Information ©2015 [employer]. This information is not intended to replace advice given to you by your health care provider. Make sure you discuss any questions you have with your health care provider. °Breastfeeding and Mastitis °Mastitis is inflammation of the breast tissue. It can occur in women who are   breastfeeding. This can make breastfeeding painful. Mastitis will sometimes go away  on its own. Your health care provider will help determine if treatment is needed. °CAUSES °Mastitis is often associated with a blocked milk (lactiferous) duct. This can happen when too much milk builds up in the breast. Causes of excess milk in the breast can include: °· Poor latch-on. If your baby is not latched onto the breast properly, she or he may not empty your breast completely while breastfeeding. °· Allowing too much time to pass between feedings. °· Wearing a bra or other clothing that is too tight. This puts extra pressure on the lactiferous ducts so milk does not flow through them as it should. °Mastitis can also be caused by a bacterial infection. Bacteria may enter the breast tissue through cuts or openings in the skin. In women who are breastfeeding, this may occur because of cracked or irritated skin. Cracks in the skin are often caused when your baby does not latch on properly to the breast. °SIGNS AND SYMPTOMS °· Swelling, redness, tenderness, and pain in an area of the breast. °· Swelling of the glands under the arm on the same side. °· Fever may or may not accompany mastitis. °If an infection is allowed to progress, a collection of pus (abscess) may develop. °DIAGNOSIS  °Your health care provider can usually diagnose mastitis based on your symptoms and a physical exam. Tests may be done to help confirm the diagnosis. These may include: °· Removal of pus from the breast by applying pressure to the area. This pus can be examined in the lab to determine which bacteria are present. If an abscess has developed, the fluid in the abscess can be removed with a needle. This can also be used to confirm the diagnosis and determine the bacteria present. In most cases, pus will not be present. °· Blood tests to determine if your body is fighting a bacterial infection. °· Mammogram or ultrasound tests to rule out other problems or diseases. °TREATMENT  °Mastitis that occurs with breastfeeding will sometimes go  away on its own. Your health care provider may choose to wait 24 hours after first seeing you to decide whether a prescription medicine is needed. If your symptoms are worse after 24 hours, your health care provider will likely prescribe an antibiotic medicine to treat the mastitis. He or she will determine which bacteria are most likely causing the infection and will then select an appropriate antibiotic medicine. This is sometimes changed based on the results of tests performed to identify the bacteria, or if there is no response to the antibiotic medicine selected. Antibiotic medicines are usually given by mouth. You may also be given medicine for pain. °HOME CARE INSTRUCTIONS °· Only take over-the-counter or prescription medicines for pain, fever, or discomfort as directed by your health care provider. °· If your health care provider prescribed an antibiotic medicine, take the medicine as directed. Make sure you finish it even if you start to feel better. °· Do not wear a tight or underwire bra. Wear a soft, supportive bra. °· Increase your fluid intake, especially if you have a fever. °· Continue to empty the breast. Your health care provider can tell you whether this milk is safe for your infant or needs to be thrown out. You may be told to stop nursing until your health care provider thinks it is safe for your baby. Use a breast pump if you are advised to stop nursing. °· Keep your nipples clean   and dry. °· Empty the first breast completely before going to the other breast. If your baby is not emptying your breasts completely for some reason, use a breast pump to empty your breasts. °· If you go back to work, pump your breasts while at work to stay in time with your nursing schedule. °· Avoid allowing your breasts to become overly filled with milk (engorged). °SEEK MEDICAL CARE IF: °· You have pus-like discharge from the breast. °· Your symptoms do not improve with the treatment prescribed by your health care  provider within 2 days. °SEEK IMMEDIATE MEDICAL CARE IF: °· Your pain and swelling are getting worse. °· You have pain that is not controlled with medicine. °· You have a red line extending from the breast toward your armpit. °· You have a fever or persistent symptoms for more than 2-3 days. °· You have a fever and your symptoms suddenly get worse. °MAKE SURE YOU:  °· Understand these instructions. °· Will watch your condition. °· Will get help right away if you are not doing well or get worse. °Document Released: 02/14/2005 Document Revised: 10/25/2013 Document Reviewed: 05/26/2013 °[employer]® Patient Information ©2015 [employer]. This information is not intended to replace advice given to you by your health care provider. Make sure you discuss any questions you have with your health care provider. °Breastfeeding °Deciding to breastfeed is one of the best choices you can make for you and your baby. A change in hormones during pregnancy causes your breast tissue to grow and increases the number and size of your milk ducts. These hormones also allow proteins, sugars, and fats from your blood supply to make breast milk in your milk-producing glands. Hormones prevent breast milk from being released before your baby is born as well as prompt milk flow after birth. Once breastfeeding has begun, thoughts of your baby, as well as his or her sucking or crying, can stimulate the release of milk from your milk-producing glands.  °BENEFITS OF BREASTFEEDING °For Your Baby °· Your first milk (colostrum) helps your baby's digestive system function better.   °· There are antibodies in your milk that help your baby fight off infections.   °· Your baby has a lower incidence of asthma, allergies, and sudden infant death syndrome.   °· The nutrients in breast milk are better for your baby than infant formulas and are designed uniquely for your baby's needs.   °· Breast milk improves your baby's brain development.   °· Your baby is  less likely to develop other conditions, such as childhood obesity, asthma, or type 2 diabetes mellitus.   °For You  °· Breastfeeding helps to create a very special bond between you and your baby.   °· Breastfeeding is convenient. Breast milk is always available at the correct temperature and costs nothing.   °· Breastfeeding helps to burn calories and helps you lose the weight gained during pregnancy.   °· Breastfeeding makes your uterus contract to its prepregnancy size faster and slows bleeding (lochia) after you give birth.   °· Breastfeeding helps to lower your risk of developing type 2 diabetes mellitus, osteoporosis, and breast or ovarian cancer later in life. °SIGNS THAT YOUR BABY IS HUNGRY °Early Signs of Hunger  °· Increased alertness or activity. °· Stretching. °· Movement of the head from side to side. °· Movement of the head and opening of the mouth when the corner of the mouth or cheek is stroked (rooting). °· Increased sucking sounds, smacking lips, cooing, sighing, or squeaking. °· Hand-to-mouth movements. °· Increased sucking of fingers   or hands. °Late Signs of Hunger °· Fussing. °· Intermittent crying. °Extreme Signs of Hunger °Signs of extreme hunger will require calming and consoling before your baby will be able to breastfeed successfully. Do not wait for the following signs of extreme hunger to occur before you initiate breastfeeding:   °· Restlessness. °· A loud, strong cry. °·  Screaming. °BREASTFEEDING BASICS °Breastfeeding Initiation °· Find a comfortable place to sit or lie down, with your neck and back well supported. °· Place a pillow or rolled up blanket under your baby to bring him or her to the level of your breast (if you are seated). Nursing pillows are specially designed to help support your arms and your baby while you breastfeed. °· Make sure that your baby's abdomen is facing your abdomen.   °· Gently massage your breast. With your fingertips, massage from your chest wall toward  your nipple in a circular motion. This encourages milk flow. You may need to continue this action during the feeding if your milk flows slowly. °· Support your breast with 4 fingers underneath and your thumb above your nipple. Make sure your fingers are well away from your nipple and your baby's mouth.   °· Stroke your baby's lips gently with your finger or nipple.   °· When your baby's mouth is open wide enough, quickly bring your baby to your breast, placing your entire nipple and as much of the colored area around your nipple (areola) as possible into your baby's mouth.   °¨ More areola should be visible above your baby's upper lip than below the lower lip.   °¨ Your baby's tongue should be between his or her lower gum and your breast.   °· Ensure that your baby's mouth is correctly positioned around your nipple (latched). Your baby's lips should create a seal on your breast and be turned out (everted). °· It is common for your baby to suck about 2-3 minutes in order to start the flow of breast milk. °Latching °Teaching your baby how to latch on to your breast properly is very important. An improper latch can cause nipple pain and decreased milk supply for you and poor weight gain in your baby. Also, if your baby is not latched onto your nipple properly, he or she may swallow some air during feeding. This can make your baby fussy. Burping your baby when you switch breasts during the feeding can help to get rid of the air. However, teaching your baby to latch on properly is still the best way to prevent fussiness from swallowing air while breastfeeding. °Signs that your baby has successfully latched on to your nipple:    °· Silent tugging or silent sucking, without causing you pain.   °· Swallowing heard between every 3-4 sucks.   °·  Muscle movement above and in front of his or her ears while sucking.   °Signs that your baby has not successfully latched on to nipple:  °· Sucking sounds or smacking sounds from  your baby while breastfeeding. °· Nipple pain. °If you think your baby has not latched on correctly, slip your finger into the corner of your baby's mouth to break the suction and place it between your baby's gums. Attempt breastfeeding initiation again. °Signs of Successful Breastfeeding °Signs from your baby:   °· A gradual decrease in the number of sucks or complete cessation of sucking.   °· Falling asleep.   °· Relaxation of his or her body.   °· Retention of a small amount of milk in his or her mouth.   °· Letting go of   your breast by himself or herself. °Signs from you: °· Breasts that have increased in firmness, weight, and size 1-3 hours after feeding.   °· Breasts that are softer immediately after breastfeeding. °· Increased milk volume, as well as a change in milk consistency and color by the fifth day of breastfeeding.   °· Nipples that are not sore, cracked, or bleeding. °Signs That Your Baby is Getting Enough Milk °· Wetting at least 3 diapers in a 24-hour period. The urine should be clear and pale yellow by age 5 days. °· At least 3 stools in a 24-hour period by age 5 days. The stool should be soft and yellow. °· At least 3 stools in a 24-hour period by age 7 days. The stool should be seedy and yellow. °· No loss of weight greater than 10% of birth weight during the first 3 days of age. °· Average weight gain of 4-7 ounces (113-198 g) per week after age 4 days. °· Consistent daily weight gain by age 5 days, without weight loss after the age of 2 weeks. °After a feeding, your baby may spit up a small amount. This is common. °BREASTFEEDING FREQUENCY AND DURATION °Frequent feeding will help you make more milk and can prevent sore nipples and breast engorgement. Breastfeed when you feel the need to reduce the fullness of your breasts or when your baby shows signs of hunger. This is called "breastfeeding on demand." Avoid introducing a pacifier to your baby while you are working to establish breastfeeding  (the first 4-6 weeks after your baby is born). After this time you may choose to use a pacifier. Research has shown that pacifier use during the first year of a baby's life decreases the risk of sudden infant death syndrome (SIDS). °Allow your baby to feed on each breast as long as he or she wants. Breastfeed until your baby is finished feeding. When your baby unlatches or falls asleep while feeding from the first breast, offer the second breast. Because newborns are often sleepy in the first few weeks of life, you may need to awaken your baby to get him or her to feed. °Breastfeeding times will vary from baby to baby. However, the following rules can serve as a guide to help you ensure that your baby is properly fed: °· Newborns (babies 4 weeks of age or younger) may breastfeed every 1-3 hours. °· Newborns should not go longer than 3 hours during the day or 5 hours during the night without breastfeeding. °· You should breastfeed your baby a minimum of 8 times in a 24-hour period until you begin to introduce solid foods to your baby at around 6 months of age. °BREAST MILK PUMPING °Pumping and storing breast milk allows you to ensure that your baby is exclusively fed your breast milk, even at times when you are unable to breastfeed. This is especially important if you are going back to work while you are still breastfeeding or when you are not able to be present during feedings. Your lactation consultant can give you guidelines on how long it is safe to store breast milk.  °A breast pump is a machine that allows you to pump milk from your breast into a sterile bottle. The pumped breast milk can then be stored in a refrigerator or freezer. Some breast pumps are operated by hand, while others use electricity. Ask your lactation consultant which type will work best for you. Breast pumps can be purchased, but some hospitals and breastfeeding support groups lease   breast pumps on a monthly basis. A lactation consultant can  teach you how to hand express breast milk, if you prefer not to use a pump.  °CARING FOR YOUR BREASTS WHILE YOU BREASTFEED °Nipples can become dry, cracked, and sore while breastfeeding. The following recommendations can help keep your breasts moisturized and healthy: °· Avoid using soap on your nipples.   °· Wear a supportive bra. Although not required, special nursing bras and tank tops are designed to allow access to your breasts for breastfeeding without taking off your entire bra or top. Avoid wearing underwire-style bras or extremely tight bras. °· Air dry your nipples for 3-4 minutes after each feeding.   °· Use only cotton bra pads to absorb leaked breast milk. Leaking of breast milk between feedings is normal.   °· Use lanolin on your nipples after breastfeeding. Lanolin helps to maintain your skin's normal moisture barrier. If you use pure lanolin, you do not need to wash it off before feeding your baby again. Pure lanolin is not toxic to your baby. You may also hand express a few drops of breast milk and gently massage that milk into your nipples and allow the milk to air dry. °In the first few weeks after giving birth, some women experience extremely full breasts (engorgement). Engorgement can make your breasts feel heavy, warm, and tender to the touch. Engorgement peaks within 3-5 days after you give birth. The following recommendations can help ease engorgement: °· Completely empty your breasts while breastfeeding or pumping. You may want to start by applying warm, moist heat (in the shower or with warm water-soaked hand towels) just before feeding or pumping. This increases circulation and helps the milk flow. If your baby does not completely empty your breasts while breastfeeding, pump any extra milk after he or she is finished. °· Wear a snug bra (nursing or regular) or tank top for 1-2 days to signal your body to slightly decrease milk production. °· Apply ice packs to your breasts, unless this is  too uncomfortable for you. °· Make sure that your baby is latched on and positioned properly while breastfeeding. °If engorgement persists after 48 hours of following these recommendations, contact your health care provider or a lactation consultant. °OVERALL HEALTH CARE RECOMMENDATIONS WHILE BREASTFEEDING °· Eat healthy foods. Alternate between meals and snacks, eating 3 of each per day. Because what you eat affects your breast milk, some of the foods may make your baby more irritable than usual. Avoid eating these foods if you are sure that they are negatively affecting your baby. °· Drink milk, fruit juice, and water to satisfy your thirst (about 10 glasses a day).   °· Rest often, relax, and continue to take your prenatal vitamins to prevent fatigue, stress, and anemia. °· Continue breast self-awareness checks. °· Avoid chewing and smoking tobacco. °· Avoid alcohol and drug use. °Some medicines that may be harmful to your baby can pass through breast milk. It is important to ask your health care provider before taking any medicine, including all over-the-counter and prescription medicine as well as vitamin and herbal supplements. °It is possible to become pregnant while breastfeeding. If birth control is desired, ask your health care provider about options that will be safe for your baby. °SEEK MEDICAL CARE IF:  °· You feel like you want to stop breastfeeding or have become frustrated with breastfeeding. °· You have painful breasts or nipples. °· Your nipples are cracked or bleeding. °· Your breasts are red, tender, or warm. °· You have a   swollen area on either breast. °· You have a fever or chills. °· You have nausea or vomiting. °· You have drainage other than breast milk from your nipples. °· Your breasts do not become full before feedings by the fifth day after you give birth. °· You feel sad and depressed. °· Your baby is too sleepy to eat well. °· Your baby is having trouble sleeping.   °· Your baby is  wetting less than 3 diapers in a 24-hour period. °· Your baby has less than 3 stools in a 24-hour period. °· Your baby's skin or the white part of his or her eyes becomes yellow.   °· Your baby is not gaining weight by 5 days of age. °SEEK IMMEDIATE MEDICAL CARE IF:  °· Your baby is overly tired (lethargic) and does not want to wake up and feed. °· Your baby develops an unexplained fever. °Document Released: 10/20/2005 Document Revised: 10/25/2013 Document Reviewed: 04/13/2013 °[employer]® Patient Information ©2015 [employer]. This information is not intended to replace advice given to you by your health care provider. Make sure you discuss any questions you have with your health care provider. ° °

## 2015-04-18 NOTE — Progress Notes (Signed)
Patient ID: SHADANA MASAKI, female   DOB: 03/25/1981, 34 y.o.   MRN: [id number] PPD # 1 S/P VAVD  S:  Reports feeling well             Tolerating po/ No nausea or vomiting             Bleeding is light             Pain controlled with ibuprofen (OTC)             Up ad lib / ambulatory / voiding without difficulties    Newborn  Information for the patient's newborn:  Leoria, Peglow [[id number]]  female  breast  feeding  / Circumcision planning   O:  A & O x 3, in no apparent distress              VS:  Filed Vitals:   04/17/15 1901 04/17/15 1940 04/17/15 2104 04/18/15 0242  BP: 129/74 137/77 137/71 124/63  Pulse: 86 86 81 85  Temp:  99.5 F (37.5 C)  98.6 F (37 C)  TempSrc:  Oral  Oral  Resp: 18 18  18   Height:      Weight:      SpO2:  99% 99% 98%    LABS:  Recent Labs  04/17/15 0734 04/18/15 0545  WBC 13.2* 19.0*  HGB 12.1 10.3*  HCT 34.1* 29.3*  PLT 255 220    Blood type: A POS (06/14 0734)  Rubella: Immune (11/11 0000)   I&O: I/O last 3 completed shifts: In: -  Out: 972 [Urine:400; Blood:572]             Lungs: Clear and unlabored  Heart: regular rate and rhythm / no murmurs  Abdomen: soft, non-tender, non-distended              Fundus: firm, non-tender, U-1  Perineum: Median episiotomy repair healing well / no edema, ecchymosis, or drainage  Lochia: minimal  Extremities: No edema, no calf pain or tenderness, No Homans    A/P: PPD # 1  34 y.o., [id number]   Principal Problem:    Status post vacuum-assisted vaginal delivery (6/14)  Active Problems:    GBS Positive - delivered / Abx therapy x 3 doses in labor   Doing well - stable status  Routine post partum orders  Anticipate discharge tomorrow    Raelyn Mora, M, MSN, CNM 04/18/2015, 8:59 AM

## 2015-04-18 NOTE — Progress Notes (Signed)
MOB was referred for history of depression/anxiety.  Referral is screened out by Clinical Social Worker because none of the following criteria appear to apply: -History of anxiety/depression during this pregnancy, or of post-partum depression. - Diagnosis of anxiety and/or depression within last 3 years or -MOB's symptoms are currently being treated with medication and/or therapy. MOB is currently prescribed Cymbalta.  Please contact the Clinical Social Worker if needs arise or upon MOB request.   Loleta Books, LCSW [phone]

## 2015-04-18 NOTE — Discharge Summary (Signed)
Obstetric Discharge Summary Reason for Admission: onset of labor Prenatal Procedures: ultrasound Intrapartum Course: Admitted in active labor / epidural for pain management / normal progression to complete dilation / fetal bradycardia and thick meconium necessitated decision for VAVD of viable female after informed verbal consent / median episiotomy to facilitate rapid delivery by Dr. Billy Coast / no immediate postpartum complications noted / desires early discharge on PPD 1  Intrapartum Procedures: vacuum and episiotomy median Postpartum Procedures: none Complications-Operative and Postpartum: median episiotomy repair HEMOGLOBIN  Date Value Ref Range Status  04/18/2015 10.3* 12.0 - 15.0 g/dL Final  77/82/4235 36.1 12.2 - 16.2 g/dL Final   HCT  Date Value Ref Range Status  04/18/2015 29.3* 36.0 - 46.0 % Final    Physical Exam:  General: alert, cooperative and no distress Lochia: appropriate Uterine Fundus: firm, midline, U-1 Perineum: healing well, no significant drainage, no dehiscence, no significant erythema DVT Evaluation: No evidence of DVT seen on physical exam. Negative Homan's sign. No cords or calf tenderness. No significant calf/ankle edema.  Discharge Diagnoses: Term Pregnancy-delivered and S/P Vacuum-Assisted Vaginal Delivery with Median episiotomy  Discharge Information: Date: 04/18/2015 Activity: pelvic rest Diet: routine Medications: PNV and Ibuprofen Condition: stable Instructions: refer to practice specific booklet Discharge to: home Follow-up Information    Follow up with Lenoard Aden, MD In 1 week.   Specialty:  Obstetrics and Gynecology   Why:  For EVALUATION FOR POSTPARTUM DEPRESSION with DR. TAAVON   Contact information:   [address] [place] [address] [phone]       Follow up with Lenoard Aden, MD. Schedule an appointment as soon as possible for a visit in 6 weeks.   Specialty:  Obstetrics and Gynecology   Why:  postpartum visit   Contact information:   [address] [place] [address] [phone]       Newborn Data: Live born female on 04/17/2015 Birth Weight: 7 lb 12.2 oz (3520 g) APGAR: 7, 9  Home with mother.  Monica Mahoney, M MSN, CNM 04/18/2015, 10:23 AM

## 2015-04-18 NOTE — Anesthesia Postprocedure Evaluation (Signed)
Anesthesia Post Note  Patient: Monica Mahoney  Procedure(s) Performed: * No procedures listed *  Anesthesia type: Epidural  Patient location: Mother/Baby  Post pain: Pain level controlled  Post assessment: Post-op Vital signs reviewed  Last Vitals:  Filed Vitals:   04/18/15 0242  BP: 124/63  Pulse: 85  Temp: 37 C  Resp: 18    Post vital signs: Reviewed  Level of consciousness:alert  Complications: No apparent anesthesia complications

## 2015-04-18 NOTE — Lactation Note (Signed)
This note was copied from the chart of Monica Espn Whritenour. Lactation Consultation Note: Initial visit- baby was circ'd and is asleep in [place] at present. Mom reports that baby has had several good feeding latching with no pain. Reviewed cluster feeding. Encouraged frequent feedings to promote a good milk supply. Bf brochure given with resources for support after DC. No questions at present. To call prn  Patient Name: Monica Mahoney [id number] Date: 04/18/2015 Reason for consult: Initial assessment   Maternal Data Formula Feeding for Exclusion: No Does the patient have breastfeeding experience prior to this delivery?: Yes  Feeding    LATCH Score/Interventions                      Lactation Tools Discussed/Used     Consult Status Consult Status: Follow-up Date: 04/19/15 Follow-up type: In-patient    Pamelia Hoit 04/18/2015, 3:22 PM

## 2015-04-19 ENCOUNTER — Telehealth: Payer: Self-pay | Admitting: Family Medicine

## 2015-04-19 NOTE — Telephone Encounter (Signed)
Patient has recently had a baby and while she was pregnant she stopped taking all meds.  She has a follow up appointment to get back on her medications on 04/25/2015 but she was wanting to know if she could have Rx for Adderall XR 30 mg and Hydrocodone 5-325 PRN to get her through until that appointment.

## 2015-04-19 NOTE — Telephone Encounter (Signed)
Spoke with the patient and informed her that due to the fact that the medications were controlled they could not be sent in electronically. Patient verbalized understanding.

## 2015-04-19 NOTE — Telephone Encounter (Signed)
Due to new lasw re controlled substance , sorry

## 2015-04-25 ENCOUNTER — Ambulatory Visit (INDEPENDENT_AMBULATORY_CARE_PROVIDER_SITE_OTHER): Payer: 59 | Admitting: Family Medicine

## 2015-04-25 ENCOUNTER — Encounter: Payer: Self-pay | Admitting: Family Medicine

## 2015-04-25 VITALS — BP 134/90 | Ht 61.5 in | Wt 168.2 lb

## 2015-04-25 DIAGNOSIS — G8929 Other chronic pain: Secondary | ICD-10-CM | POA: Diagnosis not present

## 2015-04-25 DIAGNOSIS — F419 Anxiety disorder, unspecified: Secondary | ICD-10-CM | POA: Diagnosis not present

## 2015-04-25 DIAGNOSIS — M549 Dorsalgia, unspecified: Secondary | ICD-10-CM | POA: Diagnosis not present

## 2015-04-25 DIAGNOSIS — F900 Attention-deficit hyperactivity disorder, predominantly inattentive type: Secondary | ICD-10-CM | POA: Diagnosis not present

## 2015-04-25 MED ORDER — ALPRAZOLAM 0.5 MG PO TABS: 0.5000 mg | ORAL_TABLET | Freq: Every evening | ORAL | Status: DC | PRN

## 2015-04-25 MED ORDER — HYDROCODONE-ACETAMINOPHEN 5-325 MG PO TABS: 1.0000 | ORAL_TABLET | ORAL | Status: DC | PRN

## 2015-04-25 MED ORDER — AMPHETAMINE-DEXTROAMPHET ER 20 MG PO CP24: 20.0000 mg | ORAL_CAPSULE | Freq: Every day | ORAL | Status: DC
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
## 2015-04-25 NOTE — Patient Instructions (Signed)
Low Back Sprain with Rehab  A sprain is an injury in which a ligament is torn. The ligaments of the lower back are vulnerable to sprains. However, they are strong and require great force to be injured. These ligaments are important for stabilizing the spinal column. Sprains are classified into three categories. Grade 1 sprains cause pain, but the tendon is not lengthened. Grade 2 sprains include a lengthened ligament, due to the ligament being stretched or partially ruptured. With grade 2 sprains there is still function, although the function may be decreased. Grade 3 sprains involve a complete tear of the tendon or muscle, and function is usually impaired. SYMPTOMS   Severe pain in the lower back.  Sometimes, a feeling of a "pop," "snap," or tear, at the time of injury.  Tenderness and sometimes swelling at the injury site.  Uncommonly, bruising (contusion) within 48 hours of injury.  Muscle spasms in the back. CAUSES  Low back sprains occur when a force is placed on the ligaments that is greater than they can handle. Common causes of injury include:  Performing a stressful act while off-balance.  Repetitive stressful activities that involve movement of the lower back.  Direct hit (trauma) to the lower back. RISK INCREASES WITH:  Contact sports (football, wrestling).  Collisions (major skiing accidents).  Sports that require throwing or lifting (baseball, weightlifting).  Sports involving twisting of the spine (gymnastics, diving, tennis, golf).  Poor strength and flexibility.  Inadequate protection.  Previous back injury or surgery (especially fusion). PREVENTION  Wear properly fitted and padded protective equipment.  Warm up and stretch properly before activity.  Allow for adequate recovery between workouts.  Maintain physical fitness:  Strength, flexibility, and endurance.  Cardiovascular fitness.  Maintain a healthy body weight. PROGNOSIS  If treated  properly, low back sprains usually heal with non-surgical treatment. The length of time for healing depends on the severity of the injury.  RELATED COMPLICATIONS   Recurring symptoms, resulting in a chronic problem.  Chronic inflammation and pain in the low back.  Delayed healing or resolution of symptoms, especially if activity is resumed too soon.  Prolonged impairment.  Unstable or arthritic joints of the low back. TREATMENT  Treatment first involves the use of ice and medicine, to reduce pain and inflammation. The use of strengthening and stretching exercises may help reduce pain with activity. These exercises may be performed at home or with a therapist. Severe injuries may require referral to a therapist for further evaluation and treatment, such as ultrasound. Your caregiver may advise that you wear a back brace or corset, to help reduce pain and discomfort. Often, prolonged bed rest results in greater harm then benefit. Corticosteroid injections may be recommended. However, these should be reserved for the most serious cases. It is important to avoid using your back when lifting objects. At night, sleep on your back on a firm mattress, with a pillow placed under your knees. If non-surgical treatment is unsuccessful, surgery may be needed.  MEDICATION   If pain medicine is needed, nonsteroidal anti-inflammatory medicines (aspirin and ibuprofen), or other minor pain relievers (acetaminophen), are often advised.  Do not take pain medicine for 7 days before surgery.  Prescription pain relievers may be given, if your caregiver thinks they are needed. Use only as directed and only as much as you need.  Ointments applied to the skin may be helpful.  Corticosteroid injections may be given by your caregiver. These injections should be reserved for the most serious cases,   because they may only be given a certain number of times. HEAT AND COLD  Cold treatment (icing) should be applied for 10  to 15 minutes every 2 to 3 hours for inflammation and pain, and immediately after activity that aggravates your symptoms. Use ice packs or an ice massage.  Heat treatment may be used before performing stretching and strengthening activities prescribed by your caregiver, physical therapist, or athletic trainer. Use a heat pack or a warm water soak. SEEK MEDICAL CARE IF:   Symptoms get worse or do not improve in 2 to 4 weeks, despite treatment.  You develop numbness or weakness in either leg.  You lose bowel or bladder function.  Any of the following occur after surgery: fever, increased pain, swelling, redness, drainage of fluids, or bleeding in the affected area.  New, unexplained symptoms develop. (Drugs used in treatment may produce side effects.) EXERCISES  RANGE OF MOTION (ROM) AND STRETCHING EXERCISES - Low Back Sprain Most people with lower back pain will find that their symptoms get worse with excessive bending forward (flexion) or arching at the lower back (extension). The exercises that will help resolve your symptoms will focus on the opposite motion.  Your physician, physical therapist or athletic trainer will help you determine which exercises will be most helpful to resolve your lower back pain. Do not complete any exercises without first consulting with your caregiver. Discontinue any exercises which make your symptoms worse, until you speak to your caregiver. If you have pain, numbness or tingling which travels down into your buttocks, leg or foot, the goal of the therapy is for these symptoms to move closer to your back and eventually resolve. Sometimes, these leg symptoms will get better, but your lower back pain may worsen. This is often an indication of progress in your rehabilitation. Be very alert to any changes in your symptoms and the activities in which you participated in the 24 hours prior to the change. Sharing this information with your caregiver will allow him or her to  most efficiently treat your condition. These exercises may help you when beginning to rehabilitate your injury. Your symptoms may resolve with or without further involvement from your physician, physical therapist or athletic trainer. While completing these exercises, remember:   Restoring tissue flexibility helps normal motion to return to the joints. This allows healthier, less painful movement and activity.  An effective stretch should be held for at least 30 seconds.  A stretch should never be painful. You should only feel a gentle lengthening or release in the stretched tissue. FLEXION RANGE OF MOTION AND STRETCHING EXERCISES: STRETCH - Flexion, Single Knee to Chest   Lie on a firm bed or floor with both legs extended in front of you.  Keeping one leg in contact with the floor, bring your opposite knee to your chest. Hold your leg in place by either grabbing behind your thigh or at your knee.  Pull until you feel a gentle stretch in your low back. Hold __________ seconds.  Slowly release your grasp and repeat the exercise with the opposite side. Repeat __________ times. Complete this exercise __________ times per day.  STRETCH - Flexion, Double Knee to Chest  Lie on a firm bed or floor with both legs extended in front of you.  Keeping one leg in contact with the floor, bring your opposite knee to your chest.  Tense your stomach muscles to support your back and then lift your other knee to your chest. Hold your legs   in place by either grabbing behind your thighs or at your knees.  Pull both knees toward your chest until you feel a gentle stretch in your low back. Hold __________ seconds.  Tense your stomach muscles and slowly return one leg at a time to the floor. Repeat __________ times. Complete this exercise __________ times per day.  STRETCH - Low Trunk Rotation  Lie on a firm bed or floor. Keeping your legs in front of you, bend your knees so they are both pointed toward the  ceiling and your feet are flat on the floor.  Extend your arms out to the side. This will stabilize your upper body by keeping your shoulders in contact with the floor.  Gently and slowly drop both knees together to one side until you feel a gentle stretch in your low back. Hold for __________ seconds.  Tense your stomach muscles to support your lower back as you bring your knees back to the starting position. Repeat the exercise to the other side. Repeat __________ times. Complete this exercise __________ times per day  EXTENSION RANGE OF MOTION AND FLEXIBILITY EXERCISES: STRETCH - Extension, Prone on Elbows   Lie on your stomach on the floor, a bed will be too soft. Place your palms about shoulder width apart and at the height of your head.  Place your elbows under your shoulders. If this is too painful, stack pillows under your chest.  Allow your body to relax so that your hips drop lower and make contact more completely with the floor.  Hold this position for __________ seconds.  Slowly return to lying flat on the floor. Repeat __________ times. Complete this exercise __________ times per day.  RANGE OF MOTION - Extension, Prone Press Ups  Lie on your stomach on the floor, a bed will be too soft. Place your palms about shoulder width apart and at the height of your head.  Keeping your back as relaxed as possible, slowly straighten your elbows while keeping your hips on the floor. You may adjust the placement of your hands to maximize your comfort. As you gain motion, your hands will come more underneath your shoulders.  Hold this position __________ seconds.  Slowly return to lying flat on the floor. Repeat __________ times. Complete this exercise __________ times per day.  RANGE OF MOTION- Quadruped, Neutral Spine   Assume a hands and knees position on a firm surface. Keep your hands under your shoulders and your knees under your hips. You may place padding under your knees for  comfort.  Drop your head and point your tailbone toward the ground below you. This will round out your lower back like an angry cat. Hold this position for __________ seconds.  Slowly lift your head and release your tail bone so that your back sags into a large arch, like an old horse.  Hold this position for __________ seconds.  Repeat this until you feel limber in your low back.  Now, find your "sweet spot." This will be the most comfortable position somewhere between the two previous positions. This is your neutral spine. Once you have found this position, tense your stomach muscles to support your low back.  Hold this position for __________ seconds. Repeat __________ times. Complete this exercise __________ times per day.  STRENGTHENING EXERCISES - Low Back Sprain These exercises may help you when beginning to rehabilitate your injury. These exercises should be done near your "sweet spot." This is the neutral, low-back arch, somewhere between fully rounded   and fully arched, that is your least painful position. When performed in this safe range of motion, these exercises can be used for people who have either a flexion or extension based injury. These exercises may resolve your symptoms with or without further involvement from your physician, physical therapist or athletic trainer. While completing these exercises, remember:   Muscles can gain both the endurance and the strength needed for everyday activities through controlled exercises.  Complete these exercises as instructed by your physician, physical therapist or athletic trainer. Increase the resistance and repetitions only as guided.  You may experience muscle soreness or fatigue, but the pain or discomfort you are trying to eliminate should never worsen during these exercises. If this pain does worsen, stop and make certain you are following the directions exactly. If the pain is still present after adjustments, discontinue the  exercise until you can discuss the trouble with your caregiver. STRENGTHENING - Deep Abdominals, Pelvic Tilt   Lie on a firm bed or floor. Keeping your legs in front of you, bend your knees so they are both pointed toward the ceiling and your feet are flat on the floor.  Tense your lower abdominal muscles to press your low back into the floor. This motion will rotate your pelvis so that your tail bone is scooping upwards rather than pointing at your feet or into the floor. With a gentle tension and even breathing, hold this position for __________ seconds. Repeat __________ times. Complete this exercise __________ times per day.  STRENGTHENING - Abdominals, Crunches   Lie on a firm bed or floor. Keeping your legs in front of you, bend your knees so they are both pointed toward the ceiling and your feet are flat on the floor. Cross your arms over your chest.  Slightly tip your chin down without bending your neck.  Tense your abdominals and slowly lift your trunk high enough to just clear your shoulder blades. Lifting higher can put excessive stress on the lower back and does not further strengthen your abdominal muscles.  Control your return to the starting position. Repeat __________ times. Complete this exercise __________ times per day.  STRENGTHENING - Quadruped, Opposite UE/LE Lift   Assume a hands and knees position on a firm surface. Keep your hands under your shoulders and your knees under your hips. You may place padding under your knees for comfort.  Find your neutral spine and gently tense your abdominal muscles so that you can maintain this position. Your shoulders and hips should form a rectangle that is parallel with the floor and is not twisted.  Keeping your trunk steady, lift your right hand no higher than your shoulder and then your left leg no higher than your hip. Make sure you are not holding your breath. Hold this position for __________ seconds.  Continuing to keep  your abdominal muscles tense and your back steady, slowly return to your starting position. Repeat with the opposite arm and leg. Repeat __________ times. Complete this exercise __________ times per day.  STRENGTHENING - Abdominals and Quadriceps, Straight Leg Raise   Lie on a firm bed or floor with both legs extended in front of you.  Keeping one leg in contact with the floor, bend the other knee so that your foot can rest flat on the floor.  Find your neutral spine, and tense your abdominal muscles to maintain your spinal position throughout the exercise.  Slowly lift your straight leg off the floor about 6 inches for a count   of 15, making sure to not hold your breath.  Still keeping your neutral spine, slowly lower your leg all the way to the floor. Repeat this exercise with each leg __________ times. Complete this exercise __________ times per day. POSTURE AND BODY MECHANICS CONSIDERATIONS - Low Back Sprain Keeping correct posture when sitting, standing or completing your activities will reduce the stress put on different body tissues, allowing injured tissues a chance to heal and limiting painful experiences. The following are general guidelines for improved posture. Your physician or physical therapist will provide you with any instructions specific to your needs. While reading these guidelines, remember:  The exercises prescribed by your provider will help you have the flexibility and strength to maintain correct postures.  The correct posture provides the best environment for your joints to work. All of your joints have less wear and tear when properly supported by a spine with good posture. This means you will experience a healthier, less painful body.  Correct posture must be practiced with all of your activities, especially prolonged sitting and standing. Correct posture is as important when doing repetitive low-stress activities (typing) as it is when doing a single heavy-load  activity (lifting). RESTING POSITIONS Consider which positions are most painful for you when choosing a resting position. If you have pain with flexion-based activities (sitting, bending, stooping, squatting), choose a position that allows you to rest in a less flexed posture. You would want to avoid curling into a fetal position on your side. If your pain worsens with extension-based activities (prolonged standing, working overhead), avoid resting in an extended position such as sleeping on your stomach. Most people will find more comfort when they rest with their spine in a more neutral position, neither too rounded nor too arched. Lying on a non-sagging bed on your side with a pillow between your knees, or on your back with a pillow under your knees will often provide some relief. Keep in mind, being in any one position for a prolonged period of time, no matter how correct your posture, can still lead to stiffness. PROPER SITTING POSTURE In order to minimize stress and discomfort on your spine, you must sit with correct posture. Sitting with good posture should be effortless for a healthy body. Returning to good posture is a gradual process. Many people can work toward this most comfortably by using various supports until they have the flexibility and strength to maintain this posture on their own. When sitting with proper posture, your ears will fall over your shoulders and your shoulders will fall over your hips. You should use the back of the chair to support your upper back. Your lower back will be in a neutral position, just slightly arched. You may place a small pillow or folded towel at the base of your lower back for  support.  When working at a desk, create an environment that supports good, upright posture. Without extra support, muscles tire, which leads to excessive strain on joints and other tissues. Keep these recommendations in mind: CHAIR:  A chair should be able to slide under your desk  when your back makes contact with the back of the chair. This allows you to work closely.  The chair's height should allow your eyes to be level with the upper part of your monitor and your hands to be slightly lower than your elbows. BODY POSITION  Your feet should make contact with the floor. If this is not possible, use a foot rest.  Keep your   ears over your shoulders. This will reduce stress on your neck and low back. INCORRECT SITTING POSTURES  If you are feeling tired and unable to assume a healthy sitting posture, do not slouch or slump. This puts excessive strain on your back tissues, causing more damage and pain. Healthier options include:  Using more support, like a lumbar pillow.  Switching tasks to something that requires you to be upright or walking.  Talking a brief walk.  Lying down to rest in a neutral-spine position. PROLONGED STANDING WHILE SLIGHTLY LEANING FORWARD  When completing a task that requires you to lean forward while standing in one place for a long time, place either foot up on a stationary 2-4 inch high object to help maintain the best posture. When both feet are on the ground, the lower back tends to lose its slight inward curve. If this curve flattens (or becomes too large), then the back and your other joints will experience too much stress, tire more quickly, and can cause pain. CORRECT STANDING POSTURES Proper standing posture should be assumed with all daily activities, even if they only take a few moments, like when brushing your teeth. As in sitting, your ears should fall over your shoulders and your shoulders should fall over your hips. You should keep a slight tension in your abdominal muscles to brace your spine. Your tailbone should point down to the ground, not behind your body, resulting in an over-extended swayback posture.  INCORRECT STANDING POSTURES  Common incorrect standing postures include a forward head, locked knees and/or an excessive  swayback. WALKING Walk with an upright posture. Your ears, shoulders and hips should all line-up. PROLONGED ACTIVITY IN A FLEXED POSITION When completing a task that requires you to bend forward at your waist or lean over a low surface, try to find a way to stabilize 3 out of 4 of your limbs. You can place a hand or elbow on your thigh or rest a knee on the surface you are reaching across. This will provide you more stability, so that your muscles do not tire as quickly. By keeping your knees relaxed, or slightly bent, you will also reduce stress across your lower back. CORRECT LIFTING TECHNIQUES DO :  Assume a wide stance. This will provide you more stability and the opportunity to get as close as possible to the object which you are lifting.  Tense your abdominals to brace your spine. Bend at the knees and hips. Keeping your back locked in a neutral-spine position, lift using your leg muscles. Lift with your legs, keeping your back straight.  Test the weight of unknown objects before attempting to lift them.  Try to keep your elbows locked down at your sides in order get the best strength from your shoulders when carrying an object.  Always ask for help when lifting heavy or awkward objects. INCORRECT LIFTING TECHNIQUES DO NOT:   Lock your knees when lifting, even if it is a small object.  Bend and twist. Pivot at your feet or move your feet when needing to change directions.  Assume that you can safely pick up even a paperclip without proper posture. Document Released: 10/20/2005 Document Revised: 01/12/2012 Document Reviewed: 02/01/2009 [employer] Patient Information 2015 [employer]. This information is not intended to replace advice given to you by your health care provider. Make sure you discuss any questions you have with your health care provider.  

## 2015-06-04 ENCOUNTER — Other Ambulatory Visit: Payer: Self-pay | Admitting: Nurse Practitioner

## 2015-06-04 ENCOUNTER — Other Ambulatory Visit: Payer: Self-pay | Admitting: Family Medicine

## 2015-06-04 MED ORDER — DULOXETINE HCL 30 MG PO CPEP: 30.0000 mg | ORAL_CAPSULE | Freq: Every day | ORAL | Status: DC

## 2015-06-04 MED ORDER — PANTOPRAZOLE SODIUM 40 MG PO TBEC: DELAYED_RELEASE_TABLET | ORAL | Status: DC

## 2015-06-04 NOTE — Telephone Encounter (Signed)
Started by ob wanted to cont six mo ref both

## 2015-06-04 NOTE — Telephone Encounter (Signed)
cymbalta not filled by [place] in [facility]

## 2015-06-04 NOTE — Telephone Encounter (Signed)
Patient needs prescriptions with refills on cymbalta 60 mg, protonix 40 mg called into [facility].

## 2015-06-04 NOTE — Telephone Encounter (Signed)
Rx sent electronically to pharmacy. Patient notified. 

## 2015-07-20 ENCOUNTER — Ambulatory Visit (HOSPITAL_COMMUNITY): Payer: Self-pay | Admitting: Psychology

## 2015-07-23 ENCOUNTER — Ambulatory Visit (INDEPENDENT_AMBULATORY_CARE_PROVIDER_SITE_OTHER): Payer: 59 | Admitting: Family Medicine

## 2015-07-23 VITALS — BP 130/82 | Ht 61.5 in | Wt 166.0 lb

## 2015-07-23 DIAGNOSIS — F419 Anxiety disorder, unspecified: Secondary | ICD-10-CM

## 2015-07-23 DIAGNOSIS — M549 Dorsalgia, unspecified: Secondary | ICD-10-CM | POA: Diagnosis not present

## 2015-07-23 DIAGNOSIS — F900 Attention-deficit hyperactivity disorder, predominantly inattentive type: Secondary | ICD-10-CM | POA: Diagnosis not present

## 2015-07-23 DIAGNOSIS — G8929 Other chronic pain: Secondary | ICD-10-CM | POA: Diagnosis not present

## 2015-07-23 MED ORDER — BUPROPION HCL ER (SR) 150 MG PO TB12: ORAL_TABLET | ORAL | Status: DC

## 2015-07-23 MED ORDER — AMPHETAMINE-DEXTROAMPHET ER 30 MG PO CP24: 30.0000 mg | ORAL_CAPSULE | ORAL | Status: DC

## 2015-07-23 MED ORDER — HYDROCODONE-ACETAMINOPHEN 5-325 MG PO TABS: 1.0000 | ORAL_TABLET | ORAL | Status: DC | PRN

## 2015-07-23 MED ORDER — AMPHETAMINE-DEXTROAMPHETAMINE 10 MG PO TABS: ORAL_TABLET | ORAL | Status: DC

## 2015-07-23 MED ORDER — DULOXETINE HCL 30 MG PO CPEP: ORAL_CAPSULE | ORAL | Status: DC

## 2015-07-23 NOTE — Progress Notes (Signed)
   Subjective:    Patient ID: Monica Mahoney, female    DOB: 1980-12-14, 34 y.o.   MRN: [id number] Patient arrives office with numerous concerns HPI This patient was seen today for chronic pain  Patient also needs refill ADHD meds  The medication list was reviewed and updated.   -Compliance with pain medication: yes- hydrocodone -once a day for back pain  The patient was advised the importance of maintaining medication and not using illegal substances with these.  Refills needed: yes  The patient was educated that we can provide 3 monthly scripts for their medication, it is their responsibility to follow the instructions.  Side effects or complications from medications: none  Patient is aware that pain medications are meant to minimize the severity of the pain to allow their pain levels to improve to allow for better function. They are aware of that pain medications cannot totally remove their pain.  Due for UDT ( at least once per year) : n/a     Got back to work, having some difficulty with the  Having a challenge with the adderall 20, feels needs incr in the med. Feels need stronger dose. Has been on higher doses in the past.  Ongoing bk pain, stretching exercises, walking daily  Due to see counselor soon . Depression and anxiety significant worsening. Patient states this was a problem even before pregnancy. Feels Cymbalta is no longer done appropriate. Requests a change in medicine.      Review of Systems Positive back pain no chest pain no headache positive fatigue now back to full-time work    Objective:   Physical Exam  Alert vitals stable lungs clear heart rare rhythm low back tender to palpation negative straight leg raise bilateral neuro exam intact affect appropriate not excessively dysphoric or depressed, no suicidal thoughts no homicidal thoughts      Assessment & Plan:  Impression ADHD suboptimum discusspatient wishes to increase ADHD meds #2 chronic back  pain discussed #3 depression anxiety suboptimum with desire to change medication planincrease Adderall dose rationale discussed. Transitional Cymbalta initiate Wellbutrin rationale and method discussed. Local measures discussed. Pain medication written. Recheck in several months WSL

## 2015-07-31 ENCOUNTER — Ambulatory Visit (INDEPENDENT_AMBULATORY_CARE_PROVIDER_SITE_OTHER): Payer: 59 | Admitting: Psychology

## 2015-07-31 DIAGNOSIS — F331 Major depressive disorder, recurrent, moderate: Secondary | ICD-10-CM | POA: Diagnosis not present

## 2015-07-31 DIAGNOSIS — F411 Generalized anxiety disorder: Secondary | ICD-10-CM

## 2015-08-21 ENCOUNTER — Ambulatory Visit (INDEPENDENT_AMBULATORY_CARE_PROVIDER_SITE_OTHER): Payer: 59 | Admitting: Psychology

## 2015-08-21 DIAGNOSIS — F411 Generalized anxiety disorder: Secondary | ICD-10-CM

## 2015-08-21 DIAGNOSIS — F331 Major depressive disorder, recurrent, moderate: Secondary | ICD-10-CM | POA: Diagnosis not present

## 2015-08-22 ENCOUNTER — Encounter (HOSPITAL_COMMUNITY): Payer: Self-pay | Admitting: Psychology
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
## 2015-08-29 ENCOUNTER — Other Ambulatory Visit: Payer: Self-pay | Admitting: Family Medicine

## 2015-08-30 ENCOUNTER — Encounter (HOSPITAL_COMMUNITY): Payer: Self-pay | Admitting: Emergency Medicine

## 2015-08-30 ENCOUNTER — Emergency Department (HOSPITAL_COMMUNITY)
Admission: EM | Admit: 2015-08-30 | Discharge: 2015-08-30 | Disposition: A | Discharge status: Home / Self Care | Payer: 59 | Attending: Emergency Medicine | Admitting: Emergency Medicine

## 2015-08-30 DIAGNOSIS — Y9389 Activity, other specified: Secondary | ICD-10-CM | POA: Insufficient documentation

## 2015-08-30 DIAGNOSIS — G8929 Other chronic pain: Secondary | ICD-10-CM | POA: Insufficient documentation

## 2015-08-30 DIAGNOSIS — F419 Anxiety disorder, unspecified: Secondary | ICD-10-CM | POA: Insufficient documentation

## 2015-08-30 DIAGNOSIS — F909 Attention-deficit hyperactivity disorder, unspecified type: Secondary | ICD-10-CM | POA: Diagnosis not present

## 2015-08-30 DIAGNOSIS — S161XXA Strain of muscle, fascia and tendon at neck level, initial encounter: Secondary | ICD-10-CM | POA: Diagnosis not present

## 2015-08-30 DIAGNOSIS — Z862 Personal history of diseases of the blood and blood-forming organs and certain disorders involving the immune mechanism: Secondary | ICD-10-CM | POA: Insufficient documentation

## 2015-08-30 DIAGNOSIS — S199XXA Unspecified injury of neck, initial encounter: Secondary | ICD-10-CM | POA: Diagnosis present

## 2015-08-30 DIAGNOSIS — Z79899 Other long term (current) drug therapy: Secondary | ICD-10-CM | POA: Insufficient documentation

## 2015-08-30 DIAGNOSIS — Y998 Other external cause status: Secondary | ICD-10-CM | POA: Insufficient documentation

## 2015-08-30 DIAGNOSIS — Y92410 Unspecified street and highway as the place of occurrence of the external cause: Secondary | ICD-10-CM | POA: Insufficient documentation

## 2015-08-30 DIAGNOSIS — Z8619 Personal history of other infectious and parasitic diseases: Secondary | ICD-10-CM | POA: Diagnosis not present

## 2015-08-30 DIAGNOSIS — F329 Major depressive disorder, single episode, unspecified: Secondary | ICD-10-CM | POA: Diagnosis not present

## 2015-08-30 DIAGNOSIS — Z8781 Personal history of (healed) traumatic fracture: Secondary | ICD-10-CM | POA: Diagnosis not present

## 2015-08-30 MED ORDER — IBUPROFEN 600 MG PO TABS: 600.0000 mg | ORAL_TABLET | Freq: Three times a day (TID) | ORAL | Status: DC | PRN
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
## 2015-10-08 ENCOUNTER — Encounter (HOSPITAL_COMMUNITY): Payer: Self-pay | Admitting: Psychology

## 2015-10-08 ENCOUNTER — Ambulatory Visit (INDEPENDENT_AMBULATORY_CARE_PROVIDER_SITE_OTHER): Payer: 59 | Admitting: Psychology

## 2015-10-08 DIAGNOSIS — F411 Generalized anxiety disorder: Secondary | ICD-10-CM | POA: Diagnosis not present

## 2015-10-08 DIAGNOSIS — F331 Major depressive disorder, recurrent, moderate: Secondary | ICD-10-CM | POA: Diagnosis not present
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
## 2015-10-22 ENCOUNTER — Ambulatory Visit (INDEPENDENT_AMBULATORY_CARE_PROVIDER_SITE_OTHER): Payer: 59 | Admitting: Family Medicine

## 2015-10-22 ENCOUNTER — Encounter: Payer: Self-pay | Admitting: Family Medicine

## 2015-10-22 VITALS — BP 124/86 | Ht 61.5 in | Wt 165.0 lb

## 2015-10-22 DIAGNOSIS — F900 Attention-deficit hyperactivity disorder, predominantly inattentive type: Secondary | ICD-10-CM | POA: Diagnosis not present

## 2015-10-22 DIAGNOSIS — IMO0001 Reserved for inherently not codable concepts without codable children: Secondary | ICD-10-CM

## 2015-10-22 DIAGNOSIS — F419 Anxiety disorder, unspecified: Secondary | ICD-10-CM | POA: Diagnosis not present

## 2015-10-22 DIAGNOSIS — M549 Dorsalgia, unspecified: Secondary | ICD-10-CM

## 2015-10-22 DIAGNOSIS — G8929 Other chronic pain: Secondary | ICD-10-CM

## 2015-10-22 DIAGNOSIS — R030 Elevated blood-pressure reading, without diagnosis of hypertension: Secondary | ICD-10-CM

## 2015-10-22 MED ORDER — AMPHETAMINE-DEXTROAMPHETAMINE 10 MG PO TABS: ORAL_TABLET | ORAL | Status: DC

## 2015-10-22 MED ORDER — BUPROPION HCL ER (SR) 150 MG PO TB12: ORAL_TABLET | ORAL | Status: DC

## 2015-10-22 MED ORDER — AMPHETAMINE-DEXTROAMPHET ER 30 MG PO CP24: 30.0000 mg | ORAL_CAPSULE | ORAL | Status: DC

## 2015-10-22 MED ORDER — PANTOPRAZOLE SODIUM 40 MG PO TBEC: DELAYED_RELEASE_TABLET | ORAL | Status: DC

## 2015-10-22 MED ORDER — HYDROCODONE-ACETAMINOPHEN 5-325 MG PO TABS: 1.0000 | ORAL_TABLET | ORAL | Status: DC | PRN

## 2015-10-22 MED ORDER — CETIRIZINE HCL 10 MG PO TABS: 10.0000 mg | ORAL_TABLET | Freq: Every day | ORAL | Status: DC

## 2015-10-22 MED ORDER — ALPRAZOLAM 0.5 MG PO TABS: 0.5000 mg | ORAL_TABLET | Freq: Every evening | ORAL | Status: DC | PRN

## 2015-10-22 NOTE — Progress Notes (Signed)
   Subjective:    Patient ID: Monica Mahoney, female    DOB: 1981-10-20, 34 y.o.   MRN: [id number]  HPI/This patient was seen today for chronic pain  The medication list was reviewed and updated.   -Compliance with medication: yes  - Number patient states they take daily: as needed. Has not taken in a couple of weeks.   -when was the last dose patient took? About 2 weeks ago  The patient was advised the importance of maintaining medication and not using illegal substances with these.  Refills needed: yes  The patient was educated that we can provide 3 monthly scripts for their medication, it is their responsibility to follow the instructions.  Side effects or complications from medications: none  Patient is aware that pain medications are meant to minimize the severity of the pain to allow their pain levels to improve to allow for better function. They are aware of that pain medications cannot totally remove their pain.  Due for UDT ( at least once per year) : pt has been without pain med for two weeks.   Needs refills on all meds.   No other concerns or problems today.  adhd meds stable, now prn oat work  Helps at work with   Due anlthe rinjection, has pain and needs pone,   patient compliant with ADHD medication. States overall definitely helped her. Does not miss a dose. Generally uses 1 working. Next. Medications reviewed today.  Handling new antidepressant well. Wellbutrin seems to be doing better for her. Reports compliance. Medications reviewed today.   ongoing challenges with chronic back pain. Due to have another injection soon.  Exercise not good.  wellbutrin doing better,      Review of Systems  no headache no chest pain no abdominal pain no change in bowel habits no rash ROS otherwise negative    Objective:   Physical Exam  alert vitals stable blood pressure  Decent on repeat not perfect heart regular in rhythm HEENT normal lungs clear heart  good       Assessment & Plan:   impression 1 elevated blood pressure discuss not enough for medicines at #2 ADHD good control maintain same meds meds reviewed #3 depression handling numerous well reviewed #4 chronic pain ongoing need for meds helps her function plan diet exercise discussed. Pain medicines prescribed. Symptom care discussed. Exercise encourage recheck in several months WSL

## 2015-11-02 ENCOUNTER — Telehealth (HOSPITAL_COMMUNITY): Payer: Self-pay | Admitting: *Deleted

## 2015-11-02 NOTE — Telephone Encounter (Signed)
lmtcb number provided. Need to resch appt for pt due to provider out of office at that time.

## 2015-11-06 ENCOUNTER — Telehealth: Payer: Self-pay | Admitting: Family Medicine

## 2015-11-06 ENCOUNTER — Telehealth (HOSPITAL_COMMUNITY): Payer: Self-pay | Admitting: *Deleted

## 2015-11-06 NOTE — Telephone Encounter (Signed)
Patien was seen 12/19 and given 3 scripts to be filled 10/22/15, 11/21/15/ and 12/21/15. Patient should not need any rxs

## 2015-11-06 NOTE — Telephone Encounter (Signed)
HYDROcodone-acetaminophen (NORCO/VICODIN) 5-325 MG tablet  Refill please send to outpatient pharmacy please   Does not need a whole month just enough to get her through next  Tuesday

## 2015-11-06 NOTE — Telephone Encounter (Signed)
Left message to return call 

## 2015-11-07 ENCOUNTER — Ambulatory Visit (HOSPITAL_COMMUNITY): Payer: Self-pay | Admitting: Psychology

## 2015-11-07 MED ORDER — HYDROCODONE-ACETAMINOPHEN 5-325 MG PO TABS: ORAL_TABLET | ORAL | Status: DC

## 2015-11-07 MED FILL — HYDROCODON-APAP 5-325: 5-325 | 20 days supply | Qty: 20 | Fill #0

## 2015-11-07 NOTE — Telephone Encounter (Signed)
Tell pt with new rules that's problematic. We can see if her pharm will honor one time rx. Rx: numb twenty on up to bid prn for pain and Writre "extra written due to flare" on it. Any further narcotic rx's will need to see face to face, or if pharm denies this, wil l need to see face to face

## 2015-11-07 NOTE — Telephone Encounter (Signed)
Patient was notified and verbalized understanding. Script at front desk ready for pickup.

## 2015-11-07 NOTE — Telephone Encounter (Signed)
Patient was only given 30 tablets and she currently has a flare up of her back pain. Patient states that she is scheduled for an injection on Tuesday at 8 am. Patient's next script is dated for 11/21/15 but she was only given 30 tablets to take every 4 hours prn.

## 2015-11-13 DIAGNOSIS — M5410 Radiculopathy, site unspecified: Secondary | ICD-10-CM | POA: Diagnosis not present

## 2015-11-22 MED FILL — HYDROCODON-APAP 5-325: 5-325 | 5 days supply | Qty: 30 | Fill #0

## 2015-11-28 ENCOUNTER — Ambulatory Visit (INDEPENDENT_AMBULATORY_CARE_PROVIDER_SITE_OTHER): Payer: 59 | Admitting: Psychology

## 2015-11-28 DIAGNOSIS — F331 Major depressive disorder, recurrent, moderate: Secondary | ICD-10-CM

## 2015-11-28 DIAGNOSIS — F411 Generalized anxiety disorder: Secondary | ICD-10-CM | POA: Diagnosis not present

## 2015-11-29 ENCOUNTER — Encounter (HOSPITAL_COMMUNITY): Payer: Self-pay | Admitting: Psychology
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
## 2015-12-11 ENCOUNTER — Encounter: Payer: Self-pay | Admitting: Family Medicine

## 2015-12-11 ENCOUNTER — Ambulatory Visit (INDEPENDENT_AMBULATORY_CARE_PROVIDER_SITE_OTHER): Payer: 59 | Admitting: Family Medicine

## 2015-12-11 VITALS — BP 130/86 | Temp 98.3°F | Ht 61.5 in | Wt 156.0 lb

## 2015-12-11 DIAGNOSIS — J209 Acute bronchitis, unspecified: Secondary | ICD-10-CM | POA: Diagnosis not present

## 2015-12-11 MED ORDER — CEFDINIR 300 MG PO CAPS: 300.0000 mg | ORAL_CAPSULE | Freq: Two times a day (BID) | ORAL | Status: DC

## 2015-12-11 MED FILL — CEFDINIR 300 MG CAPSULE: 300 | 10 days supply | Qty: 20 | Fill #0

## 2015-12-11 NOTE — Progress Notes (Signed)
   Subjective:    Patient ID: Monica Mahoney, female    DOB: 1980/12/19, 35 y.o.   MRN: [id number]  Cough This is a new problem. The current episode started in the past 7 days. Associated symptoms include a fever, nasal congestion and a sore throat. Treatments tried: tylenol cold and flu, mucinex and nyquil.   Fri night coughing and throat was sore  Sat started withc coughing and vomiting and fever  No energy and achey  Now prod cough  Cough productive of yellowish conchae phlegm   No headache Review of Systems  Constitutional: Positive for fever.  HENT: Positive for sore throat.   Respiratory: Positive for cough.        Objective:   Physical Exam  Alert moderate malaise. Hydration good. HEENT moderate nasal congestion frank normal lungs bronchial cough no wheezes or crackles heart regular rhythm      Assessment & Plan:  Impression post flu bronchitis plan antibiotics prescribed. Since Medicare discussed warning signs discussed WSL

## 2015-12-21 ENCOUNTER — Ambulatory Visit (INDEPENDENT_AMBULATORY_CARE_PROVIDER_SITE_OTHER): Payer: 59 | Admitting: Psychology

## 2015-12-21 DIAGNOSIS — F411 Generalized anxiety disorder: Secondary | ICD-10-CM | POA: Diagnosis not present

## 2015-12-21 DIAGNOSIS — F331 Major depressive disorder, recurrent, moderate: Secondary | ICD-10-CM

## 2015-12-21 MED FILL — ALPRAZolam 0.5 MG TABS: 0.5 | 30 days supply | Qty: 30 | Fill #0

## 2015-12-21 MED FILL — HYDROCODON-APAP 5-325: 5-325 | 5 days supply | Qty: 30 | Fill #0

## 2015-12-24 MED FILL — PANTOPRAZOLE SOD DR 40 MG T: 40 | 90 days supply | Qty: 90 | Fill #0

## 2016-01-21 ENCOUNTER — Ambulatory Visit: Payer: 59 | Admitting: Family Medicine

## 2016-01-22 ENCOUNTER — Ambulatory Visit (INDEPENDENT_AMBULATORY_CARE_PROVIDER_SITE_OTHER): Payer: 59 | Admitting: Psychology

## 2016-01-23 ENCOUNTER — Encounter: Payer: Self-pay | Admitting: Family Medicine

## 2016-01-23 ENCOUNTER — Ambulatory Visit (INDEPENDENT_AMBULATORY_CARE_PROVIDER_SITE_OTHER): Payer: 59 | Admitting: Family Medicine

## 2016-01-23 VITALS — BP 122/90 | Ht 61.5 in | Wt 156.1 lb

## 2016-01-23 DIAGNOSIS — M549 Dorsalgia, unspecified: Secondary | ICD-10-CM | POA: Diagnosis not present

## 2016-01-23 DIAGNOSIS — F900 Attention-deficit hyperactivity disorder, predominantly inattentive type: Secondary | ICD-10-CM | POA: Diagnosis not present

## 2016-01-23 DIAGNOSIS — G8929 Other chronic pain: Secondary | ICD-10-CM | POA: Diagnosis not present

## 2016-01-23 MED ORDER — AMPHETAMINE-DEXTROAMPHET ER 30 MG PO CP24: 30.0000 mg | ORAL_CAPSULE | ORAL | Status: DC

## 2016-01-23 MED ORDER — HYDROCODONE-ACETAMINOPHEN 5-325 MG PO TABS: ORAL_TABLET | ORAL | Status: DC

## 2016-01-23 MED ORDER — PANTOPRAZOLE SODIUM 40 MG PO TBEC: DELAYED_RELEASE_TABLET | ORAL | Status: DC

## 2016-01-23 MED ORDER — AMPHETAMINE-DEXTROAMPHETAMINE 10 MG PO TABS: ORAL_TABLET | ORAL | Status: DC

## 2016-01-23 MED FILL — DEXTROAMP-AMPHET ER 30 MG C: 30 | 90 days supply | Qty: 90 | Fill #0

## 2016-01-23 MED FILL — HYDROCODON-APAP 5-325: 5-325 | 15 days supply | Qty: 30 | Fill #0

## 2016-01-23 MED FILL — AMPHETAMINE SALTS 10 MG TAB: 10 | 90 days supply | Qty: 90 | Fill #0

## 2016-01-23 NOTE — Progress Notes (Signed)
   Subjective:    Patient ID: Monica Mahoney, female    DOB: May 27, 1981, 35 y.o.   MRN: [id number]  HPI This patient was seen today for chronic pain  The medication list was reviewed and updated.   -Compliance with medication: yes  - Number patient states they take daily: varies  -when was the last dose patient took: early this month   The patient was advised the importance of maintaining medication and not using illegal substances with these.  Refills needed: yes  The patient was educated that we can provide 3 monthly scripts for their medication, it is their responsibility to follow the instructions.  Side effects or complications from medications: none  Patient is aware that pain medications are meant to minimize the severity of the pain to allow their pain levels to improve to allow for better function. They are aware of that pain medications cannot totally remove their pain.  Due for UDT ( at least once per year) : utd  Patient states that she has no concerns today.   injec has helped,  Potential future injections   No pain meds lately   states overall he she meds working well. No obvious side effects. Definitely help her focus  Review of Systems No headache, no major weight loss or weight gain, no chest pain no  newback pain abdominal pain no change in bowel habits complete ROS otherwise negative     Objective:   Physical Exam    alert vital stable HEENT normal. Lungs clear heart regular rhythm low back some pain percussion negative straight leg raise on left which is usual side of sciatic involvement     Assessment & Plan:   impression 1 ADHD #2 chronic back pain plan medications refilled diet exercise discussed recheck in several months

## 2016-01-23 NOTE — Patient Instructions (Signed)

## 2016-02-04 MED FILL — BUPROPION SR 150 MG TABLET: 150 | 90 days supply | Qty: 180 | Fill #1

## 2016-02-22 ENCOUNTER — Ambulatory Visit (HOSPITAL_COMMUNITY): Payer: Self-pay | Admitting: Psychology

## 2016-02-26 MED FILL — HYDROCODON-APAP 5-325: 5-325 | 15 days supply | Qty: 30 | Fill #0

## 2016-03-10 ENCOUNTER — Ambulatory Visit (HOSPITAL_COMMUNITY): Payer: Self-pay | Admitting: Psychology

## 2016-03-12 ENCOUNTER — Ambulatory Visit (INDEPENDENT_AMBULATORY_CARE_PROVIDER_SITE_OTHER): Payer: 59 | Admitting: Psychology

## 2016-03-25 MED FILL — HYDROCODON-APAP 5-325: 5-325 | 15 days supply | Qty: 30 | Fill #0

## 2016-03-25 MED FILL — PANTOPRAZOLE SOD DR 40 MG T: 40 | 90 days supply | Qty: 90 | Fill #1

## 2016-03-25 MED FILL — ALPRAZolam 0.5 MG TABS: 0.5 | 30 days supply | Qty: 30 | Fill #1

## 2016-04-09 ENCOUNTER — Encounter (HOSPITAL_COMMUNITY): Payer: Self-pay | Admitting: Psychology
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
## 2016-04-10 ENCOUNTER — Ambulatory Visit (HOSPITAL_COMMUNITY): Payer: Self-pay | Admitting: Psychology

## 2016-04-23 ENCOUNTER — Encounter: Payer: Self-pay | Admitting: Family Medicine

## 2016-04-23 ENCOUNTER — Ambulatory Visit (INDEPENDENT_AMBULATORY_CARE_PROVIDER_SITE_OTHER): Payer: 59 | Admitting: Family Medicine

## 2016-04-23 VITALS — BP 130/85 | Ht 61.5 in | Wt 150.0 lb

## 2016-04-23 DIAGNOSIS — M549 Dorsalgia, unspecified: Secondary | ICD-10-CM

## 2016-04-23 DIAGNOSIS — K219 Gastro-esophageal reflux disease without esophagitis: Secondary | ICD-10-CM

## 2016-04-23 DIAGNOSIS — R030 Elevated blood-pressure reading, without diagnosis of hypertension: Secondary | ICD-10-CM

## 2016-04-23 DIAGNOSIS — F419 Anxiety disorder, unspecified: Secondary | ICD-10-CM

## 2016-04-23 DIAGNOSIS — IMO0001 Reserved for inherently not codable concepts without codable children: Secondary | ICD-10-CM

## 2016-04-23 DIAGNOSIS — G8929 Other chronic pain: Secondary | ICD-10-CM | POA: Diagnosis not present

## 2016-04-23 DIAGNOSIS — F900 Attention-deficit hyperactivity disorder, predominantly inattentive type: Secondary | ICD-10-CM

## 2016-04-23 MED ORDER — AMPHETAMINE-DEXTROAMPHETAMINE 10 MG PO TABS: ORAL_TABLET | ORAL | Status: DC

## 2016-04-23 MED ORDER — HYDROCODONE-ACETAMINOPHEN 5-325 MG PO TABS: ORAL_TABLET | ORAL | Status: DC

## 2016-04-23 MED ORDER — AMPHETAMINE-DEXTROAMPHET ER 30 MG PO CP24: 30.0000 mg | ORAL_CAPSULE | ORAL | Status: DC

## 2016-04-23 MED ORDER — ALPRAZOLAM 0.5 MG PO TABS: 0.5000 mg | ORAL_TABLET | Freq: Every evening | ORAL | Status: DC | PRN

## 2016-04-23 NOTE — Progress Notes (Signed)
   Subjective:    Patient ID: Monica Mahoney, female    DOB: Apr 01, 1981, 35 y.o.   MRN: [id number] Patient arrives with numerous concerns HPI Patient was seen today for ADD checkup. -weight, vital signs reviewed.  The following items were covered. -Compliance with medication : Yes, takes daily  -Problems with completing homework, paying attention/taking good notes in school: N/A  -grades: N/A  - Eating patterns : Patient states feels she eats to much.  -sleeping: Patient states sleeping is good. Sleeps about 6 hours a night.  -Additional issues or questions: Patient would like a referral to Chiropractor. Also would like to discuss hypertension.  Walking more, less eating out, has lost six pounds, would like to stat running in  fam hx mom 's side  dias often elevated   Blood pressures have been elevated at times when checked elsewhere. Strong family history of hypertension. Patient's blood pressure elevated during pregnancy. Admits to recent stress along with occasional headache  Feels anxiety has worsened. Needs more than 30 Xanax as per month to help her symptomatology. Next  Ongoing need for pain medication. Chronic back pain. Takes medicine helps keep her:.  Review of Systems No headache, no major weight loss or weight gain, no chest pain no new back pain abdominal pain no change in bowel habits complete ROS otherwise negative     Objective:   Physical Exam  Alert no acute distress blood pressure improved on repeat HEENT normal lungs clear heart regular in rhythm low back tender to palpation      Assessment & Plan:  Impression 1 elevated blood pressure discuss not high enough for medications yet #2 back pain ongoing with need for stronger pain meds discussed #3 ADHD overall good control on current meds discussed #4 anxiety/element of depression worsening plan increase Xanax to 45 per month. Keep Wellbutrin same. Pain meds refilled. Diet exercise discussed. Referral  [facility] in [place] at patient's request recheck in 3 months

## 2016-04-24 ENCOUNTER — Ambulatory Visit: Payer: 59 | Admitting: Family Medicine

## 2016-04-24 MED FILL — HYDROCODON-APAP 5-325: 5-325 | 15 days supply | Qty: 30 | Fill #0

## 2016-04-24 MED FILL — DEXTROAMP-AMPHET ER 30 MG C: 30 | 90 days supply | Qty: 90 | Fill #0

## 2016-04-24 MED FILL — DEXTROAMP-AMP 10 MG TAB: 10 | 90 days supply | Qty: 90 | Fill #0

## 2016-04-25 ENCOUNTER — Ambulatory Visit (HOSPITAL_COMMUNITY): Payer: Self-pay | Admitting: Psychology

## 2016-05-01 ENCOUNTER — Other Ambulatory Visit: Payer: Self-pay | Admitting: Family Medicine

## 2016-05-01 MED FILL — BUPROPION SR 150 MG TABLET: 150 | 90 days supply | Qty: 180 | Fill #0

## 2016-05-23 MED FILL — HYDROCODON-APAP 5-325: 5-325 | 15 days supply | Qty: 30 | Fill #0

## 2016-05-23 MED FILL — ALPRAZolam 0.5 MG TABS: 0.5 | 45 days supply | Qty: 45 | Fill #0

## 2016-05-27 ENCOUNTER — Encounter (HOSPITAL_COMMUNITY): Payer: Self-pay | Admitting: *Deleted

## 2016-05-27 ENCOUNTER — Ambulatory Visit (HOSPITAL_COMMUNITY): Payer: Self-pay | Admitting: Psychology

## 2016-06-02 DIAGNOSIS — M5033 Other cervical disc degeneration, cervicothoracic region: Secondary | ICD-10-CM | POA: Diagnosis not present

## 2016-06-02 DIAGNOSIS — M9901 Segmental and somatic dysfunction of cervical region: Secondary | ICD-10-CM | POA: Diagnosis not present

## 2016-06-05 DIAGNOSIS — M9901 Segmental and somatic dysfunction of cervical region: Secondary | ICD-10-CM | POA: Diagnosis not present

## 2016-06-05 DIAGNOSIS — M5033 Other cervical disc degeneration, cervicothoracic region: Secondary | ICD-10-CM | POA: Diagnosis not present

## 2016-06-16 DIAGNOSIS — M5033 Other cervical disc degeneration, cervicothoracic region: Secondary | ICD-10-CM | POA: Diagnosis not present

## 2016-06-16 DIAGNOSIS — M9901 Segmental and somatic dysfunction of cervical region: Secondary | ICD-10-CM | POA: Diagnosis not present

## 2016-06-19 DIAGNOSIS — M9901 Segmental and somatic dysfunction of cervical region: Secondary | ICD-10-CM | POA: Diagnosis not present

## 2016-06-19 DIAGNOSIS — M5033 Other cervical disc degeneration, cervicothoracic region: Secondary | ICD-10-CM | POA: Diagnosis not present

## 2016-06-23 DIAGNOSIS — M9901 Segmental and somatic dysfunction of cervical region: Secondary | ICD-10-CM | POA: Diagnosis not present

## 2016-06-23 DIAGNOSIS — M5033 Other cervical disc degeneration, cervicothoracic region: Secondary | ICD-10-CM | POA: Diagnosis not present

## 2016-06-23 MED FILL — HYDROCODON-APAP 5-325: 5-325 | 15 days supply | Qty: 30 | Fill #0

## 2016-06-24 DIAGNOSIS — M5033 Other cervical disc degeneration, cervicothoracic region: Secondary | ICD-10-CM | POA: Diagnosis not present

## 2016-06-24 DIAGNOSIS — M9901 Segmental and somatic dysfunction of cervical region: Secondary | ICD-10-CM | POA: Diagnosis not present

## 2016-06-30 MED FILL — PANTOPRAZOLE SOD DR 40 MG T: 40 | 90 days supply | Qty: 90 | Fill #0

## 2016-07-10 DIAGNOSIS — M5033 Other cervical disc degeneration, cervicothoracic region: Secondary | ICD-10-CM | POA: Diagnosis not present

## 2016-07-10 DIAGNOSIS — M9901 Segmental and somatic dysfunction of cervical region: Secondary | ICD-10-CM | POA: Diagnosis not present

## 2016-07-14 DIAGNOSIS — M5033 Other cervical disc degeneration, cervicothoracic region: Secondary | ICD-10-CM | POA: Diagnosis not present

## 2016-07-14 DIAGNOSIS — M9901 Segmental and somatic dysfunction of cervical region: Secondary | ICD-10-CM | POA: Diagnosis not present

## 2016-07-21 DIAGNOSIS — M5033 Other cervical disc degeneration, cervicothoracic region: Secondary | ICD-10-CM | POA: Diagnosis not present

## 2016-07-21 DIAGNOSIS — M9901 Segmental and somatic dysfunction of cervical region: Secondary | ICD-10-CM | POA: Diagnosis not present

## 2016-07-24 ENCOUNTER — Ambulatory Visit (INDEPENDENT_AMBULATORY_CARE_PROVIDER_SITE_OTHER): Payer: 59 | Admitting: Family Medicine

## 2016-07-24 ENCOUNTER — Encounter: Payer: Self-pay | Admitting: Family Medicine

## 2016-07-24 VITALS — BP 122/82 | Ht 61.5 in | Wt 146.5 lb

## 2016-07-24 DIAGNOSIS — K219 Gastro-esophageal reflux disease without esophagitis: Secondary | ICD-10-CM

## 2016-07-24 DIAGNOSIS — F900 Attention-deficit hyperactivity disorder, predominantly inattentive type: Secondary | ICD-10-CM | POA: Diagnosis not present

## 2016-07-24 DIAGNOSIS — Z79891 Long term (current) use of opiate analgesic: Secondary | ICD-10-CM

## 2016-07-24 DIAGNOSIS — R030 Elevated blood-pressure reading, without diagnosis of hypertension: Secondary | ICD-10-CM | POA: Diagnosis not present

## 2016-07-24 DIAGNOSIS — M9901 Segmental and somatic dysfunction of cervical region: Secondary | ICD-10-CM | POA: Diagnosis not present

## 2016-07-24 DIAGNOSIS — G8929 Other chronic pain: Secondary | ICD-10-CM | POA: Diagnosis not present

## 2016-07-24 DIAGNOSIS — M5033 Other cervical disc degeneration, cervicothoracic region: Secondary | ICD-10-CM | POA: Diagnosis not present

## 2016-07-24 DIAGNOSIS — M549 Dorsalgia, unspecified: Secondary | ICD-10-CM | POA: Diagnosis not present

## 2016-07-24 DIAGNOSIS — IMO0001 Reserved for inherently not codable concepts without codable children: Secondary | ICD-10-CM

## 2016-07-24 MED ORDER — AMPHETAMINE-DEXTROAMPHET ER 30 MG PO CP24
30.0000 mg | ORAL_CAPSULE | ORAL | 0 refills | Status: DC
Start: 2016-07-24 — End: 2016-07-24

## 2016-07-24 MED ORDER — HYDROCODONE-ACETAMINOPHEN 5-325 MG PO TABS: ORAL_TABLET | ORAL | 0 refills | Status: DC

## 2016-07-24 MED ORDER — AMPHETAMINE-DEXTROAMPHETAMINE 10 MG PO TABS: ORAL_TABLET | ORAL | 0 refills | Status: DC

## 2016-07-24 MED ORDER — AMPHETAMINE-DEXTROAMPHET ER 30 MG PO CP24: 30.0000 mg | ORAL_CAPSULE | ORAL | 0 refills | Status: DC

## 2016-07-24 MED FILL — AMPHETAMINE SALTS 10 MG TAB: 10 | 67 days supply | Qty: 135 | Fill #0

## 2016-07-24 MED FILL — HYDROCODON-APAP 5-325: 5-325 | 15 days supply | Qty: 30 | Fill #0

## 2016-07-24 MED FILL — DEXTROAMP-AMPHET ER 30 MG C: 30 | 90 days supply | Qty: 90 | Fill #0

## 2016-07-24 NOTE — Progress Notes (Signed)
   Subjective:    Patient ID: Monica Mahoney, female    DOB: 1981-02-02, 35 y.o.   MRN: [id number]  HPI  This patient was seen today for chronic pain  The medication list was reviewed and updated.   -Compliance with medication: yes  - Number patient states they take daily: 1-2 daily prn  -when was the last dose patient took: 1 week ago   The patient was advised the importance of maintaining medication and not using illegal substances with these.  Refills needed: yes  The patient was educated that we can provide 3 monthly scripts for their medication, it is their responsibility to follow the instructions.  Side effects or complications from medications: none  Patient is aware that pain medications are meant to minimize the severity of the pain to allow their pain levels to improve to allow for better function. They are aware of that pain medications cannot totally remove their pain.  Due for UDT ( at least once per year) : yes  Patient states that her adderall is not working and wants it increased. Effectiveness falls often the abdomen though she needs more medicine.  Keeping a very busy schedule. X  Overall patient's mood is stable on medications. 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    waks duuirng the day, dow s a lap in the neighborhood  Patient's blood pressure has remained good when checked elsewhere.   Review of Systems No headache, no major weight loss or weight gain, no chest pain no back pain abdominal pain no change in bowel habits complete ROS otherwise negative     Objective:   Physical Exam  Alert vitals stable, NAD. Blood pressure good on repeat. HEENT normal. Lungs clear. Heart  regular rate and rhythm. Blood pressure 1:30 radiates on repeat positive low back pain to percussion.      Assessment & Plan:  Impression ADHD suboptimum discuss particularly on the weekends when she is working recommend increasing short acting Adderall No. 2 chronic pain stable. Does not use medicine every night often goes a number of days without using it. #3 elevated blood pressure stable number for depression clinically stable plan increase Adderall is noted. Diet exercise discussed. Other medications refilled diet exercise discussed recheck in several months

## 2016-07-28 DIAGNOSIS — M9901 Segmental and somatic dysfunction of cervical region: Secondary | ICD-10-CM | POA: Diagnosis not present

## 2016-07-28 DIAGNOSIS — M5033 Other cervical disc degeneration, cervicothoracic region: Secondary | ICD-10-CM | POA: Diagnosis not present

## 2016-07-31 DIAGNOSIS — M9901 Segmental and somatic dysfunction of cervical region: Secondary | ICD-10-CM | POA: Diagnosis not present

## 2016-07-31 DIAGNOSIS — M5033 Other cervical disc degeneration, cervicothoracic region: Secondary | ICD-10-CM | POA: Diagnosis not present

## 2016-07-31 LAB — TOXASSURE SELECT 13 (MW), URINE

## 2016-08-04 MED FILL — BUPROPION SR 150 MG TABLET: 150 | 90 days supply | Qty: 180 | Fill #1

## 2016-08-06 DIAGNOSIS — M5033 Other cervical disc degeneration, cervicothoracic region: Secondary | ICD-10-CM | POA: Diagnosis not present

## 2016-08-06 DIAGNOSIS — M9901 Segmental and somatic dysfunction of cervical region: Secondary | ICD-10-CM | POA: Diagnosis not present

## 2016-08-07 DIAGNOSIS — M5033 Other cervical disc degeneration, cervicothoracic region: Secondary | ICD-10-CM | POA: Diagnosis not present

## 2016-08-07 DIAGNOSIS — M9901 Segmental and somatic dysfunction of cervical region: Secondary | ICD-10-CM | POA: Diagnosis not present

## 2016-08-11 DIAGNOSIS — M5033 Other cervical disc degeneration, cervicothoracic region: Secondary | ICD-10-CM | POA: Diagnosis not present

## 2016-08-11 DIAGNOSIS — M9901 Segmental and somatic dysfunction of cervical region: Secondary | ICD-10-CM | POA: Diagnosis not present

## 2016-08-14 DIAGNOSIS — M5033 Other cervical disc degeneration, cervicothoracic region: Secondary | ICD-10-CM | POA: Diagnosis not present

## 2016-08-14 DIAGNOSIS — M9901 Segmental and somatic dysfunction of cervical region: Secondary | ICD-10-CM | POA: Diagnosis not present

## 2016-08-20 DIAGNOSIS — M5033 Other cervical disc degeneration, cervicothoracic region: Secondary | ICD-10-CM | POA: Diagnosis not present

## 2016-08-20 DIAGNOSIS — M9901 Segmental and somatic dysfunction of cervical region: Secondary | ICD-10-CM | POA: Diagnosis not present

## 2016-08-21 DIAGNOSIS — M9901 Segmental and somatic dysfunction of cervical region: Secondary | ICD-10-CM | POA: Diagnosis not present

## 2016-08-21 DIAGNOSIS — M5033 Other cervical disc degeneration, cervicothoracic region: Secondary | ICD-10-CM | POA: Diagnosis not present

## 2016-08-27 MED FILL — HYDROCODON-APAP 5-325: 5-325 | 15 days supply | Qty: 30 | Fill #0

## 2016-08-28 DIAGNOSIS — M5033 Other cervical disc degeneration, cervicothoracic region: Secondary | ICD-10-CM | POA: Diagnosis not present

## 2016-08-28 DIAGNOSIS — M9901 Segmental and somatic dysfunction of cervical region: Secondary | ICD-10-CM | POA: Diagnosis not present

## 2016-09-01 DIAGNOSIS — M9901 Segmental and somatic dysfunction of cervical region: Secondary | ICD-10-CM | POA: Diagnosis not present

## 2016-09-01 DIAGNOSIS — M5033 Other cervical disc degeneration, cervicothoracic region: Secondary | ICD-10-CM | POA: Diagnosis not present

## 2016-09-11 DIAGNOSIS — M9901 Segmental and somatic dysfunction of cervical region: Secondary | ICD-10-CM | POA: Diagnosis not present

## 2016-09-11 DIAGNOSIS — M5033 Other cervical disc degeneration, cervicothoracic region: Secondary | ICD-10-CM | POA: Diagnosis not present

## 2016-09-18 DIAGNOSIS — M5033 Other cervical disc degeneration, cervicothoracic region: Secondary | ICD-10-CM | POA: Diagnosis not present

## 2016-09-18 DIAGNOSIS — M9901 Segmental and somatic dysfunction of cervical region: Secondary | ICD-10-CM | POA: Diagnosis not present

## 2016-09-22 DIAGNOSIS — M5033 Other cervical disc degeneration, cervicothoracic region: Secondary | ICD-10-CM | POA: Diagnosis not present

## 2016-09-22 DIAGNOSIS — M9901 Segmental and somatic dysfunction of cervical region: Secondary | ICD-10-CM | POA: Diagnosis not present

## 2016-09-22 MED FILL — HYDROCODON-APAP 5-325: 5-325 | 15 days supply | Qty: 30 | Fill #0

## 2016-10-02 DIAGNOSIS — M5033 Other cervical disc degeneration, cervicothoracic region: Secondary | ICD-10-CM | POA: Diagnosis not present

## 2016-10-02 DIAGNOSIS — M9901 Segmental and somatic dysfunction of cervical region: Secondary | ICD-10-CM | POA: Diagnosis not present

## 2016-10-16 MED FILL — ALPRAZolam 0.5 MG TABS: 0.5 | 45 days supply | Qty: 45 | Fill #1

## 2016-10-16 MED FILL — PANTOPRAZOLE SOD DR 40 MG T: 40 | 90 days supply | Qty: 90 | Fill #1

## 2016-10-21 ENCOUNTER — Ambulatory Visit: Payer: 59 | Admitting: Family Medicine

## 2016-10-23 ENCOUNTER — Ambulatory Visit (INDEPENDENT_AMBULATORY_CARE_PROVIDER_SITE_OTHER): Payer: 59 | Admitting: Family Medicine

## 2016-10-23 ENCOUNTER — Encounter: Payer: Self-pay | Admitting: Family Medicine

## 2016-10-23 VITALS — BP 130/78 | Ht 61.0 in | Wt 146.0 lb

## 2016-10-23 DIAGNOSIS — K219 Gastro-esophageal reflux disease without esophagitis: Secondary | ICD-10-CM | POA: Diagnosis not present

## 2016-10-23 DIAGNOSIS — G8929 Other chronic pain: Secondary | ICD-10-CM | POA: Diagnosis not present

## 2016-10-23 DIAGNOSIS — F900 Attention-deficit hyperactivity disorder, predominantly inattentive type: Secondary | ICD-10-CM

## 2016-10-23 DIAGNOSIS — M545 Low back pain: Secondary | ICD-10-CM

## 2016-10-23 MED ORDER — HYDROCODONE-ACETAMINOPHEN 5-325 MG PO TABS: ORAL_TABLET | ORAL | 0 refills | Status: DC

## 2016-10-23 MED ORDER — BUPROPION HCL ER (SR) 150 MG PO TB12: 150.0000 mg | ORAL_TABLET | Freq: Two times a day (BID) | ORAL | 1 refills | Status: DC

## 2016-10-23 MED ORDER — AMPHETAMINE-DEXTROAMPHETAMINE 10 MG PO TABS: ORAL_TABLET | ORAL | 0 refills | Status: DC

## 2016-10-23 MED ORDER — AMPHETAMINE-DEXTROAMPHET ER 30 MG PO CP24: 30.0000 mg | ORAL_CAPSULE | ORAL | 0 refills | Status: DC

## 2016-10-23 MED FILL — DEXTROAMP-AMP 10 MG TAB: 10 | 68 days supply | Qty: 135 | Fill #0

## 2016-10-23 MED FILL — DEXTROAMP-AMPHET ER 30 MG C: 30 | 90 days supply | Qty: 90 | Fill #0

## 2016-10-23 MED FILL — HYDROCODON-APAP 5-325: 5-325 | 23 days supply | Qty: 45 | Fill #0

## 2016-10-23 NOTE — Progress Notes (Signed)
   Subjective:    Patient ID: Monica Mahoney, female    DOB: 10-May-1981, 35 y.o.   MRN: [id number]  HPI This patient was seen today for chronic pain  The medication list was reviewed and updated.   -Compliance with medication: yes  - Number patient states they take daily: 1-2  -when was the last dose patient took?   The patient was advised the importance of maintaining medication and not using illegal substances with these.  Refills needed: yes  The patient was educated that we can provide 3 monthly scripts for their medication, it is their responsibility to follow the instructions.  Side effects or complications from medications: none  Patient is aware that pain medications are meant to minimize the severity of the pain to allow their pain levels to improve to allow for better function. They are aware of that pain medications cannot totally remove their pain.  Due for UDT ( at least once per year) : last completed 07/24/16  Also needs refills of Adderal. ADHD meds still substantially helping her symptomatology. No obvious side effects. Patient reports complete compliance  chiro once per wk, less back pain, less hip pain, pverall beter . However patient finds some day she needs more than 1 pain medication in order to do reasonably well.     Anxiety up overall ok tho   Does the thirty xr daily, but not hthe extr tne when not working   protonix takes daily, Without the Protonix has substantial issues and symptomatology.,     Review of Systems No headache, no major weight loss or weight gain, no chest pain no back pain abdominal pain no change in bowel habits complete ROS otherwise negative     Objective:   Physical Exam Alert vitals stable, NAD. Blood pressure good on repeat. HEENT normal. Lungs clear. Heart regular rate and rhythm.Positive low back pain        Assessment & Plan:  Impression chronic back pain. Post traumatic. With need to increase medicine discussed  #2 chronic reflux. Pros and cons of maintaining Protonix discussed patient wishes to maintain for now due to symptomatology #3 ADHD ongoing with substantial need for medications medications prescribed plan diet exercise discussed medicines refilled. WSL

## 2016-10-31 MED FILL — BUPROPION SR 150 MG TABLET: 150 | 90 days supply | Qty: 180 | Fill #0

## 2016-11-13 DIAGNOSIS — M9901 Segmental and somatic dysfunction of cervical region: Secondary | ICD-10-CM | POA: Diagnosis not present

## 2016-11-13 DIAGNOSIS — M5033 Other cervical disc degeneration, cervicothoracic region: Secondary | ICD-10-CM | POA: Diagnosis not present

## 2016-11-24 MED FILL — HYDROCODON-APAP 5-325: 5-325 | 22 days supply | Qty: 45 | Fill #0

## 2016-11-27 DIAGNOSIS — M9901 Segmental and somatic dysfunction of cervical region: Secondary | ICD-10-CM | POA: Diagnosis not present

## 2016-11-27 DIAGNOSIS — M5033 Other cervical disc degeneration, cervicothoracic region: Secondary | ICD-10-CM | POA: Diagnosis not present

## 2016-12-11 DIAGNOSIS — M9901 Segmental and somatic dysfunction of cervical region: Secondary | ICD-10-CM | POA: Diagnosis not present

## 2016-12-11 DIAGNOSIS — M5033 Other cervical disc degeneration, cervicothoracic region: Secondary | ICD-10-CM | POA: Diagnosis not present

## 2016-12-17 DIAGNOSIS — M9901 Segmental and somatic dysfunction of cervical region: Secondary | ICD-10-CM | POA: Diagnosis not present

## 2016-12-17 DIAGNOSIS — M5033 Other cervical disc degeneration, cervicothoracic region: Secondary | ICD-10-CM | POA: Diagnosis not present

## 2016-12-18 DIAGNOSIS — M9901 Segmental and somatic dysfunction of cervical region: Secondary | ICD-10-CM | POA: Diagnosis not present

## 2016-12-18 DIAGNOSIS — M5033 Other cervical disc degeneration, cervicothoracic region: Secondary | ICD-10-CM | POA: Diagnosis not present

## 2016-12-22 MED FILL — HYDROCODON-APAP 5-325: 5-325 | 23 days supply | Qty: 45 | Fill #0

## 2017-01-01 DIAGNOSIS — M5033 Other cervical disc degeneration, cervicothoracic region: Secondary | ICD-10-CM | POA: Diagnosis not present

## 2017-01-01 DIAGNOSIS — M9901 Segmental and somatic dysfunction of cervical region: Secondary | ICD-10-CM | POA: Diagnosis not present

## 2017-01-16 ENCOUNTER — Ambulatory Visit: Payer: 59 | Admitting: Family Medicine

## 2017-01-20 ENCOUNTER — Encounter: Payer: Self-pay | Admitting: Family Medicine

## 2017-01-20 ENCOUNTER — Ambulatory Visit (INDEPENDENT_AMBULATORY_CARE_PROVIDER_SITE_OTHER): Payer: 59 | Admitting: Family Medicine

## 2017-01-20 VITALS — BP 122/82 | Ht 61.0 in | Wt 139.6 lb

## 2017-01-20 DIAGNOSIS — F900 Attention-deficit hyperactivity disorder, predominantly inattentive type: Secondary | ICD-10-CM

## 2017-01-20 DIAGNOSIS — F419 Anxiety disorder, unspecified: Secondary | ICD-10-CM

## 2017-01-20 DIAGNOSIS — M545 Low back pain, unspecified: Secondary | ICD-10-CM

## 2017-01-20 DIAGNOSIS — G8929 Other chronic pain: Secondary | ICD-10-CM | POA: Diagnosis not present

## 2017-01-20 DIAGNOSIS — J329 Chronic sinusitis, unspecified: Secondary | ICD-10-CM

## 2017-01-20 MED ORDER — HYDROCODONE-ACETAMINOPHEN 5-325 MG PO TABS: ORAL_TABLET | ORAL | 0 refills | Status: DC

## 2017-01-20 MED ORDER — AMPHETAMINE-DEXTROAMPHETAMINE 10 MG PO TABS: ORAL_TABLET | ORAL | 0 refills | Status: DC

## 2017-01-20 MED ORDER — AMPHETAMINE-DEXTROAMPHET ER 30 MG PO CP24: 30.0000 mg | ORAL_CAPSULE | ORAL | 0 refills | Status: DC

## 2017-01-20 MED ORDER — CEFDINIR 300 MG PO CAPS: 300.0000 mg | ORAL_CAPSULE | Freq: Two times a day (BID) | ORAL | 0 refills | Status: DC

## 2017-01-20 MED ORDER — ALPRAZOLAM 0.5 MG PO TABS: 0.5000 mg | ORAL_TABLET | Freq: Every evening | ORAL | 5 refills | Status: DC | PRN

## 2017-01-20 MED FILL — CEFDINIR 300 MG CAPSULE: 300 | 10 days supply | Qty: 20 | Fill #0

## 2017-01-20 NOTE — Progress Notes (Signed)
   Subjective:    Patient ID: Monica Mahoney, female    DOB: 07-11-1981, 36 y.o.   MRN: [id number]  HPI This patient was seen today for chronic pain  The medication list was reviewed and updated.   -Compliance with medication: yes  - Number patient states they take daily: 1 or 2  -when was the last dose patient took? Friday night  The patient was advised the importance of maintaining medication and not using illegal substances with these.  Refills needed: yes  The patient was educated that we can provide 3 monthly scripts for their medication, it is their responsibility to follow the instructions.  Side effects or complications from medications: none  Patient is aware that pain medications are meant to minimize the severity of the pain to allow their pain levels to improve to allow for better function. They are aware of that pain medications cannot totally remove their pain.  Patient also needs refills on ADHD medication and all her other medications  Patient also request an antibiotic for sinus issues, trouble swallwoing left side, ear pain , no fever. Some prodcutive cough, diinished enrgy and chey   Walking more now stretching back more and the chiroproactor is helping  Patient compliant with pain medication. Continues to experience the pain which led to initiation of analgesic intervention. No significant negative side effects. States definitely needs the pain medication to maintain current level of functioning. Does not receive controlled substance pain medication elsewhere.    focuing taking the long actin in the morn, then the short acting later in the day. States overall ADHD medication definitely helping. No negative side effects. Compliant with it. Helps her function with her job             Review of Systems No headache, no major weight loss or weight gain, no chest pain no back pain abdominal pain no change in bowel habits complete ROS otherwise negative    Objective:   Physical Exam Alert and oriented, vitals reviewed and stable, NAD ENT-TM's and ext canals WNLPositive nasal congestion frontal tenderness, bilat via otoscopic exam Soft palate, tonsils and post pharynx WNL via oropharyngeal exam Neck-symmetric, no masses; thyroid nonpalpable and nontender Pulmonary-no tachypnea or accessory muscle use; Clear without wheezes via auscultation Card--no abnrml murmurs, rhythm reg and rate WNL Carotid pulses symmetric, without bruits  Neuro exam intact    Assessment & Plan:  Impression 1 rhinosinusitis discussed symptom care discussed antibiotics prescribed #2 chronic back pain ongoing need for narcotics discussed #3 ADHD ongoing need for meds compliance discussed plan all medications refilled  Patient compliant with pain medication. Continues to experience the pain which led to initiation of analgesic intervention. No significant negative side effects. States definitely needs the pain medication to maintain current level of functioning. Does not receive controlled substance pain medication elsewhere.  Exercise diet discussed in encourage

## 2017-01-21 MED FILL — HYDROCODON-APAP 5-325: 5-325 | 23 days supply | Qty: 45 | Fill #0

## 2017-01-21 MED FILL — ALPRAZolam 0.5 MG TABS: 0.5 | 45 days supply | Qty: 45 | Fill #0

## 2017-01-21 MED FILL — ADDERALL XR 30 MG CAP SA: 30 | 90 days supply | Qty: 90 | Fill #0

## 2017-01-21 MED FILL — DEXTROAMP-AMPHETAMIN 10 MG: 10 | 68 days supply | Qty: 135 | Fill #0

## 2017-01-22 DIAGNOSIS — M5033 Other cervical disc degeneration, cervicothoracic region: Secondary | ICD-10-CM | POA: Diagnosis not present

## 2017-01-22 DIAGNOSIS — M9901 Segmental and somatic dysfunction of cervical region: Secondary | ICD-10-CM | POA: Diagnosis not present

## 2017-02-02 ENCOUNTER — Telehealth: Payer: Self-pay | Admitting: Family Medicine

## 2017-02-02 MED ORDER — AMOXICILLIN-POT CLAVULANATE 875-125 MG PO TABS: ORAL_TABLET | ORAL | 0 refills | Status: DC

## 2017-02-02 MED FILL — AMOX-CLAV 875-125 MG TABLET: 875-125 | 10 days supply | Qty: 20 | Fill #0

## 2017-02-02 NOTE — Telephone Encounter (Signed)
Aug 875 bid ten d 

## 2017-02-02 NOTE — Telephone Encounter (Signed)
Patient was seen 3/20 with sinus issues and given Cefdinir 300 mg. She states not feeling any better still has cough,sore throat,congestion.Would you call in something else to [facility] or would see have to be seen again

## 2017-02-02 NOTE — Telephone Encounter (Signed)
Spoke with patient and informed her per Dr.Steve Luking-  We are sending in Augmentin 875. Patient verbalized understanding.

## 2017-02-05 DIAGNOSIS — M9901 Segmental and somatic dysfunction of cervical region: Secondary | ICD-10-CM | POA: Diagnosis not present

## 2017-02-05 DIAGNOSIS — M5033 Other cervical disc degeneration, cervicothoracic region: Secondary | ICD-10-CM | POA: Diagnosis not present

## 2017-02-06 MED FILL — BUPROPION SR 150 MG TABLET: 150 | 90 days supply | Qty: 180 | Fill #1

## 2017-02-10 DIAGNOSIS — Z01419 Encounter for gynecological examination (general) (routine) without abnormal findings: Secondary | ICD-10-CM | POA: Diagnosis not present

## 2017-02-10 DIAGNOSIS — Z6825 Body mass index (BMI) 25.0-25.9, adult: Secondary | ICD-10-CM | POA: Diagnosis not present

## 2017-02-10 DIAGNOSIS — R875 Abnormal microbiological findings in specimens from female genital organs: Secondary | ICD-10-CM | POA: Diagnosis not present

## 2017-02-19 DIAGNOSIS — M9901 Segmental and somatic dysfunction of cervical region: Secondary | ICD-10-CM | POA: Diagnosis not present

## 2017-02-19 DIAGNOSIS — M5033 Other cervical disc degeneration, cervicothoracic region: Secondary | ICD-10-CM | POA: Diagnosis not present

## 2017-02-24 MED FILL — HYDROCODON-APAP 5-325: 5-325 | 22 days supply | Qty: 45 | Fill #0

## 2017-02-26 DIAGNOSIS — M5033 Other cervical disc degeneration, cervicothoracic region: Secondary | ICD-10-CM | POA: Diagnosis not present

## 2017-02-26 DIAGNOSIS — M9901 Segmental and somatic dysfunction of cervical region: Secondary | ICD-10-CM | POA: Diagnosis not present

## 2017-03-09 DIAGNOSIS — M9901 Segmental and somatic dysfunction of cervical region: Secondary | ICD-10-CM | POA: Diagnosis not present

## 2017-03-09 DIAGNOSIS — M5033 Other cervical disc degeneration, cervicothoracic region: Secondary | ICD-10-CM | POA: Diagnosis not present

## 2017-03-10 ENCOUNTER — Other Ambulatory Visit: Payer: Self-pay | Admitting: *Deleted

## 2017-03-10 MED ORDER — PANTOPRAZOLE SODIUM 40 MG PO TBEC: DELAYED_RELEASE_TABLET | ORAL | 1 refills | Status: DC

## 2017-03-10 MED FILL — PANTOPRAZOLE SOD DR 40 MG T: 40 | 90 days supply | Qty: 90 | Fill #0

## 2017-03-24 MED FILL — HYDROCODON-APAP 5-325: 5-325 | 23 days supply | Qty: 45 | Fill #0

## 2017-03-26 DIAGNOSIS — M5033 Other cervical disc degeneration, cervicothoracic region: Secondary | ICD-10-CM | POA: Diagnosis not present

## 2017-03-26 DIAGNOSIS — M9901 Segmental and somatic dysfunction of cervical region: Secondary | ICD-10-CM | POA: Diagnosis not present

## 2017-04-09 DIAGNOSIS — M5033 Other cervical disc degeneration, cervicothoracic region: Secondary | ICD-10-CM | POA: Diagnosis not present

## 2017-04-09 DIAGNOSIS — M9901 Segmental and somatic dysfunction of cervical region: Secondary | ICD-10-CM | POA: Diagnosis not present

## 2017-04-16 DIAGNOSIS — M5033 Other cervical disc degeneration, cervicothoracic region: Secondary | ICD-10-CM | POA: Diagnosis not present

## 2017-04-16 DIAGNOSIS — M9901 Segmental and somatic dysfunction of cervical region: Secondary | ICD-10-CM | POA: Diagnosis not present

## 2017-04-21 ENCOUNTER — Ambulatory Visit (INDEPENDENT_AMBULATORY_CARE_PROVIDER_SITE_OTHER): Payer: 59 | Admitting: Family Medicine

## 2017-04-21 ENCOUNTER — Encounter: Payer: Self-pay | Admitting: Family Medicine

## 2017-04-21 VITALS — BP 126/80 | Ht 61.0 in | Wt 136.5 lb

## 2017-04-21 DIAGNOSIS — F329 Major depressive disorder, single episode, unspecified: Secondary | ICD-10-CM | POA: Diagnosis not present

## 2017-04-21 DIAGNOSIS — G8929 Other chronic pain: Secondary | ICD-10-CM | POA: Diagnosis not present

## 2017-04-21 DIAGNOSIS — F419 Anxiety disorder, unspecified: Secondary | ICD-10-CM | POA: Diagnosis not present

## 2017-04-21 DIAGNOSIS — F900 Attention-deficit hyperactivity disorder, predominantly inattentive type: Secondary | ICD-10-CM | POA: Diagnosis not present

## 2017-04-21 DIAGNOSIS — F32A Depression, unspecified: Secondary | ICD-10-CM

## 2017-04-21 DIAGNOSIS — M545 Low back pain: Secondary | ICD-10-CM

## 2017-04-21 MED ORDER — AMPHETAMINE-DEXTROAMPHET ER 30 MG PO CP24: 30.0000 mg | ORAL_CAPSULE | ORAL | 0 refills | Status: DC

## 2017-04-21 MED ORDER — HYDROCODONE-ACETAMINOPHEN 5-325 MG PO TABS: ORAL_TABLET | ORAL | 0 refills | Status: DC

## 2017-04-21 MED ORDER — AMPHETAMINE-DEXTROAMPHETAMINE 10 MG PO TABS: ORAL_TABLET | ORAL | 0 refills | Status: DC

## 2017-04-21 MED ORDER — BUPROPION HCL ER (SR) 150 MG PO TB12: 150.0000 mg | ORAL_TABLET | Freq: Two times a day (BID) | ORAL | 1 refills | Status: DC

## 2017-04-21 MED FILL — BUPROPION SR 150 MG TABLET: 150 | 90 days supply | Qty: 180 | Fill #0

## 2017-04-21 NOTE — Progress Notes (Signed)
   Subjective:    Patient ID: Monica Mahoney, female    DOB: Jan 11, 1981, 36 y.o.   MRN: [id number] Patient arrives office with several distinct concerns HPI  Patient was seen today for ADD checkup. -weight, vital signs reviewed.  The following items were covered. -Compliance with medication : Takes daily   -Problems with completing homework, paying attention/taking good notes in school: None   -grades:  None   - Eating patterns : Patient states eating habits are good.   -sleeping: States sleeps well.   -Additional issues or questions: Patient states no other concerns this visit.   Not exercising much, working on [employer], overall feeling beter   atn med handling well, taking reg  Acting up more lately  Seeing chiropractor still hurting,  All the way into right calf, able to run without too much difficulty   45 tabs per mo, ok but not this past mon, lasted longer  Patient compliant with pain medication. Continues to experience the pain which led to initiation of analgesic intervention. No significant negative side effects. States definitely needs the pain medication to maintain current level of functioning. Does not receive controlled substance pain medication elsewhere.   Uses prn ibuprofen   Patient notes ongoing compliance with antidepressant medication. No obvious side effects. Reports does not miss a dose. Overall continues to help depression substantially. No thoughts of homicide or suicide. Would like to maintain medication.  stiking with meds, onl occas xanax prn , no obvious side effects.      Review of Systems No headache, no major weight loss or weight gain, no chest pain no back pain abdominal pain no change in bowel habits complete ROS otherwise negative     Objective:   Physical Exam  Alert and oriented, vitals reviewed and stable, NAD ENT-TM's and ext canals WNL bilat via otoscopic exam Soft palate, tonsils and post pharynx WNL via  oropharyngeal exam Neck-symmetric, no masses; thyroid nonpalpable and nontender Pulmonary-no tachypnea or accessory muscle use; Clear without wheezes via auscultation Card--no abnrml murmurs, rhythm reg and rate WNL Carotid pulses symmetric, without bruits Positive straight leg raise left leg positive low back pain to percussion      Assessment & Plan:  Impression 1 chronic pain discussed more need for medication will increase to 60 month #2 ADHD good control meds compliance discussed no significant side effects will maintain #3 chronic anxiety with element of depression ongoing stable on meds Ceftin once to continue discussed  Impression: Chronic pain. Patient compliant with medication. No substantial side effects. [place] controlled substance registry reviewed to ensure compliance and proper use of medication. Patient aware goal of medicine is not complete resolution of pain but to control his symptoms to improve his functional capacity. Aware of potential adverse side effects

## 2017-04-22 MED FILL — ADDERALL XR 30 MG CAP SA: 30 | 90 days supply | Qty: 90 | Fill #0

## 2017-04-22 MED FILL — AMPHETAMINE SALTS 10 MG TAB: 10 | 68 days supply | Qty: 135 | Fill #0

## 2017-04-22 MED FILL — HYDROCODON-APAP 5-325: 5-325 | 30 days supply | Qty: 60 | Fill #0

## 2017-04-23 DIAGNOSIS — M5033 Other cervical disc degeneration, cervicothoracic region: Secondary | ICD-10-CM | POA: Diagnosis not present

## 2017-04-23 DIAGNOSIS — M9901 Segmental and somatic dysfunction of cervical region: Secondary | ICD-10-CM | POA: Diagnosis not present

## 2017-04-29 DIAGNOSIS — M5033 Other cervical disc degeneration, cervicothoracic region: Secondary | ICD-10-CM | POA: Diagnosis not present

## 2017-04-29 DIAGNOSIS — M9901 Segmental and somatic dysfunction of cervical region: Secondary | ICD-10-CM | POA: Diagnosis not present

## 2017-05-20 MED FILL — ALPRAZolam 0.5 MG TABS: 0.5 | 45 days supply | Qty: 45 | Fill #1

## 2017-05-21 DIAGNOSIS — M5033 Other cervical disc degeneration, cervicothoracic region: Secondary | ICD-10-CM | POA: Diagnosis not present

## 2017-05-21 DIAGNOSIS — M9901 Segmental and somatic dysfunction of cervical region: Secondary | ICD-10-CM | POA: Diagnosis not present

## 2017-05-21 MED FILL — HYDROCODON-APAP 5-325: 5-325 | 30 days supply | Qty: 60 | Fill #0

## 2017-06-04 DIAGNOSIS — M5033 Other cervical disc degeneration, cervicothoracic region: Secondary | ICD-10-CM | POA: Diagnosis not present

## 2017-06-04 DIAGNOSIS — M9901 Segmental and somatic dysfunction of cervical region: Secondary | ICD-10-CM | POA: Diagnosis not present

## 2017-06-18 DIAGNOSIS — M5033 Other cervical disc degeneration, cervicothoracic region: Secondary | ICD-10-CM | POA: Diagnosis not present

## 2017-06-18 DIAGNOSIS — M9901 Segmental and somatic dysfunction of cervical region: Secondary | ICD-10-CM | POA: Diagnosis not present

## 2017-06-22 MED FILL — HYDROCODON-APAP 5-325: 5-325 | 30 days supply | Qty: 60 | Fill #0

## 2017-06-25 DIAGNOSIS — M5033 Other cervical disc degeneration, cervicothoracic region: Secondary | ICD-10-CM | POA: Diagnosis not present

## 2017-06-25 DIAGNOSIS — M9901 Segmental and somatic dysfunction of cervical region: Secondary | ICD-10-CM | POA: Diagnosis not present

## 2017-06-29 DIAGNOSIS — M9901 Segmental and somatic dysfunction of cervical region: Secondary | ICD-10-CM | POA: Diagnosis not present

## 2017-06-29 DIAGNOSIS — M5033 Other cervical disc degeneration, cervicothoracic region: Secondary | ICD-10-CM | POA: Diagnosis not present

## 2017-07-14 MED FILL — ALPRAZolam 0.5 MG TABS: 0.5 | 45 days supply | Qty: 45 | Fill #2

## 2017-07-15 DIAGNOSIS — M5033 Other cervical disc degeneration, cervicothoracic region: Secondary | ICD-10-CM | POA: Diagnosis not present

## 2017-07-15 DIAGNOSIS — M9901 Segmental and somatic dysfunction of cervical region: Secondary | ICD-10-CM | POA: Diagnosis not present

## 2017-07-21 ENCOUNTER — Ambulatory Visit (INDEPENDENT_AMBULATORY_CARE_PROVIDER_SITE_OTHER): Payer: 59 | Admitting: Family Medicine

## 2017-07-21 ENCOUNTER — Encounter: Payer: Self-pay | Admitting: Family Medicine

## 2017-07-21 DIAGNOSIS — F419 Anxiety disorder, unspecified: Secondary | ICD-10-CM | POA: Diagnosis not present

## 2017-07-21 DIAGNOSIS — F329 Major depressive disorder, single episode, unspecified: Secondary | ICD-10-CM | POA: Diagnosis not present

## 2017-07-21 DIAGNOSIS — G8929 Other chronic pain: Secondary | ICD-10-CM | POA: Diagnosis not present

## 2017-07-21 DIAGNOSIS — F900 Attention-deficit hyperactivity disorder, predominantly inattentive type: Secondary | ICD-10-CM

## 2017-07-21 DIAGNOSIS — M545 Low back pain: Secondary | ICD-10-CM | POA: Diagnosis not present

## 2017-07-21 DIAGNOSIS — F32A Depression, unspecified: Secondary | ICD-10-CM

## 2017-07-21 MED ORDER — AMPHETAMINE-DEXTROAMPHET ER 30 MG PO CP24: 30.0000 mg | ORAL_CAPSULE | ORAL | 0 refills | Status: DC

## 2017-07-21 MED ORDER — AMPHETAMINE-DEXTROAMPHETAMINE 10 MG PO TABS: ORAL_TABLET | ORAL | 0 refills | Status: DC

## 2017-07-21 MED ORDER — HYDROCODONE-ACETAMINOPHEN 5-325 MG PO TABS: ORAL_TABLET | ORAL | 0 refills | Status: DC

## 2017-07-21 MED ORDER — BUPROPION HCL ER (SR) 150 MG PO TB12
150.0000 mg | ORAL_TABLET | Freq: Two times a day (BID) | ORAL | 1 refills | Status: DC
Start: 2017-07-21 — End: 2017-10-22

## 2017-07-21 MED FILL — AMPHETAMINE SALTS 10 MG TAB: 10 | 68 days supply | Qty: 135 | Fill #0

## 2017-07-21 MED FILL — ADDERALL XR 30 MG CAP SA: 30 | 90 days supply | Qty: 90 | Fill #0

## 2017-07-21 MED FILL — HYDROCODON-APAP 5-325: 5-325 | 30 days supply | Qty: 60 | Fill #0

## 2017-07-21 MED FILL — BUPROPION SR 150 MG TABLET: 150 | 90 days supply | Qty: 180 | Fill #0

## 2017-07-21 NOTE — Progress Notes (Signed)
   Subjective:    Patient ID: Monica Mahoney, female    DOB: 1981-03-24, 36 y.o.   MRN: [id number]  HPI Patient arrives for a follow up on ADHD. Patient states she is doing well under current therapy and having no problems.  Patient compliant with pain medication. Continues to experience the pain which led to initiation of analgesic intervention. No significant negative side effects. States definitely needs the pain medication to maintain current level of functioning. Does not receive controlled substance pain medication elsewhere.  BACK ACTING UP PTRETTY BAD AND HURTING A FAIR AMNNT  EXPERIENCING RIGHT POST THORACID Ppain  Takes adhd meds for work and also if very buy with actiivites ADHD meds notes compliance. No obvious side effects. Definitely helps her. Takes primarily when working.  Patient notes ongoing compliance with antidepressant medication. No obvious side effects. Reports does not miss a dose. Overall continues to help depression substantially. No thoughts of homicide or suicide. Would like to maintain medication.    Review of Systems No headache, no major weight loss or weight gain, no chest pain no back pain abdominal pain no change in bowel habits complete ROS otherwise negative     Objective:   Physical Exam Alert and oriented, vitals reviewed and stable, NAD ENT-TM's and ext canals WNL bilat via otoscopic exam Soft palate, tonsils and post pharynx WNL via oropharyngeal exam Neck-symmetric, no masses; thyroid nonpalpable and nontender Pulmonary-no tachypnea or accessory muscle use; Clear without wheezes via auscultation Card--no abnrml murmurs, rhythm reg and rate WNL Carotid pulses symmetric, without bruits        Assessment & Plan:  Impression 1 ADHD good control discussed maintain same meds compliance discussed  #2 chronic back pain no element of sciatica discussed maintain same thing meds  #3 depression clinically stable pH Q9 without patient maintain  meds  Greater than 50% of this 25 minute face to face visit was spent in counseling and discussion and coordination of care regarding the above diagnosis/diagnosies

## 2017-07-23 DIAGNOSIS — M5033 Other cervical disc degeneration, cervicothoracic region: Secondary | ICD-10-CM | POA: Diagnosis not present

## 2017-07-23 DIAGNOSIS — M9901 Segmental and somatic dysfunction of cervical region: Secondary | ICD-10-CM | POA: Diagnosis not present

## 2017-07-29 DIAGNOSIS — M9901 Segmental and somatic dysfunction of cervical region: Secondary | ICD-10-CM | POA: Diagnosis not present

## 2017-07-29 DIAGNOSIS — M5033 Other cervical disc degeneration, cervicothoracic region: Secondary | ICD-10-CM | POA: Diagnosis not present

## 2017-08-06 DIAGNOSIS — M9901 Segmental and somatic dysfunction of cervical region: Secondary | ICD-10-CM | POA: Diagnosis not present

## 2017-08-06 DIAGNOSIS — M5033 Other cervical disc degeneration, cervicothoracic region: Secondary | ICD-10-CM | POA: Diagnosis not present

## 2017-08-18 DIAGNOSIS — M5033 Other cervical disc degeneration, cervicothoracic region: Secondary | ICD-10-CM | POA: Diagnosis not present

## 2017-08-18 DIAGNOSIS — M9901 Segmental and somatic dysfunction of cervical region: Secondary | ICD-10-CM | POA: Diagnosis not present

## 2017-08-20 MED FILL — HYDROCODON-APAP 5-325: 5-325 | 30 days supply | Qty: 60 | Fill #0

## 2017-08-31 DIAGNOSIS — M9901 Segmental and somatic dysfunction of cervical region: Secondary | ICD-10-CM | POA: Diagnosis not present

## 2017-08-31 DIAGNOSIS — M5033 Other cervical disc degeneration, cervicothoracic region: Secondary | ICD-10-CM | POA: Diagnosis not present

## 2017-09-09 DIAGNOSIS — M5033 Other cervical disc degeneration, cervicothoracic region: Secondary | ICD-10-CM | POA: Diagnosis not present

## 2017-09-09 DIAGNOSIS — M9901 Segmental and somatic dysfunction of cervical region: Secondary | ICD-10-CM | POA: Diagnosis not present

## 2017-09-14 DIAGNOSIS — M5033 Other cervical disc degeneration, cervicothoracic region: Secondary | ICD-10-CM | POA: Diagnosis not present

## 2017-09-14 DIAGNOSIS — M9901 Segmental and somatic dysfunction of cervical region: Secondary | ICD-10-CM | POA: Diagnosis not present

## 2017-09-21 DIAGNOSIS — M5033 Other cervical disc degeneration, cervicothoracic region: Secondary | ICD-10-CM | POA: Diagnosis not present

## 2017-09-21 DIAGNOSIS — M9901 Segmental and somatic dysfunction of cervical region: Secondary | ICD-10-CM | POA: Diagnosis not present

## 2017-09-21 MED FILL — HYDROCODON-APAP 5-325: 5-325 | 30 days supply | Qty: 60 | Fill #0

## 2017-09-22 ENCOUNTER — Encounter: Payer: 59 | Admitting: Family Medicine

## 2017-09-29 DIAGNOSIS — M9901 Segmental and somatic dysfunction of cervical region: Secondary | ICD-10-CM | POA: Diagnosis not present

## 2017-09-29 DIAGNOSIS — M5033 Other cervical disc degeneration, cervicothoracic region: Secondary | ICD-10-CM | POA: Diagnosis not present

## 2017-10-07 DIAGNOSIS — M5033 Other cervical disc degeneration, cervicothoracic region: Secondary | ICD-10-CM | POA: Diagnosis not present

## 2017-10-07 DIAGNOSIS — M9901 Segmental and somatic dysfunction of cervical region: Secondary | ICD-10-CM | POA: Diagnosis not present

## 2017-10-15 DIAGNOSIS — M9901 Segmental and somatic dysfunction of cervical region: Secondary | ICD-10-CM | POA: Diagnosis not present

## 2017-10-15 DIAGNOSIS — M5033 Other cervical disc degeneration, cervicothoracic region: Secondary | ICD-10-CM | POA: Diagnosis not present

## 2017-10-20 ENCOUNTER — Ambulatory Visit: Payer: 59 | Admitting: Family Medicine

## 2017-10-21 DIAGNOSIS — M5033 Other cervical disc degeneration, cervicothoracic region: Secondary | ICD-10-CM | POA: Diagnosis not present

## 2017-10-21 DIAGNOSIS — M9901 Segmental and somatic dysfunction of cervical region: Secondary | ICD-10-CM | POA: Diagnosis not present

## 2017-10-22 ENCOUNTER — Ambulatory Visit (INDEPENDENT_AMBULATORY_CARE_PROVIDER_SITE_OTHER): Payer: 59 | Admitting: Family Medicine

## 2017-10-22 VITALS — BP 138/94 | Ht 60.75 in | Wt 133.0 lb

## 2017-10-22 DIAGNOSIS — G8929 Other chronic pain: Secondary | ICD-10-CM

## 2017-10-22 DIAGNOSIS — M5440 Lumbago with sciatica, unspecified side: Secondary | ICD-10-CM

## 2017-10-22 DIAGNOSIS — F329 Major depressive disorder, single episode, unspecified: Secondary | ICD-10-CM | POA: Diagnosis not present

## 2017-10-22 DIAGNOSIS — Z0000 Encounter for general adult medical examination without abnormal findings: Secondary | ICD-10-CM

## 2017-10-22 DIAGNOSIS — F900 Attention-deficit hyperactivity disorder, predominantly inattentive type: Secondary | ICD-10-CM

## 2017-10-22 DIAGNOSIS — R5383 Other fatigue: Secondary | ICD-10-CM

## 2017-10-22 DIAGNOSIS — F32A Depression, unspecified: Secondary | ICD-10-CM

## 2017-10-22 DIAGNOSIS — F419 Anxiety disorder, unspecified: Secondary | ICD-10-CM

## 2017-10-22 DIAGNOSIS — Z79891 Long term (current) use of opiate analgesic: Secondary | ICD-10-CM | POA: Diagnosis not present

## 2017-10-22 DIAGNOSIS — Z13220 Encounter for screening for lipoid disorders: Secondary | ICD-10-CM

## 2017-10-22 MED ORDER — ALPRAZOLAM 0.5 MG PO TABS: 0.5000 mg | ORAL_TABLET | Freq: Every evening | ORAL | 5 refills | Status: DC | PRN

## 2017-10-22 MED ORDER — HYDROCODONE-ACETAMINOPHEN 5-325 MG PO TABS: ORAL_TABLET | ORAL | 0 refills | Status: DC

## 2017-10-22 MED ORDER — BUPROPION HCL ER (SR) 150 MG PO TB12: 150.0000 mg | ORAL_TABLET | Freq: Two times a day (BID) | ORAL | 1 refills | Status: DC

## 2017-10-22 MED ORDER — AMPHETAMINE-DEXTROAMPHET ER 30 MG PO CP24: 30.0000 mg | ORAL_CAPSULE | ORAL | 0 refills | Status: DC

## 2017-10-22 MED ORDER — AMPHETAMINE-DEXTROAMPHETAMINE 10 MG PO TABS: ORAL_TABLET | ORAL | 0 refills | Status: DC

## 2017-10-22 NOTE — Progress Notes (Signed)
Subjective:    Patient ID: Monica Mahoney, female    DOB: 12/05/1980, 36 y.o.   MRN: [id number]  HPI The patient comes in today for a wellness visit.    A review of their health history was completed.  A review of medications was also completed.  Any needed refills; all meds but would like them all printed out   Eating habits: health conscious  Falls/  MVA accidents in past few months: none  Regular exercise: walks a few times a week. 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   Specialist pt sees on regular basis: Dr. Ples Specteraavon [facility] - pt does not want pap/breast exam today. Sees gyn for this.   Preventative health issues were discussed.   Additional concerns: none  Taking the adhd meds faithfully  Has lost w3ight, overall numbers are better   Patient compliant with pain medication. Continues to experience the pain which led to initiation of analgesic intervention. No significant negative side effects. States definitely needs the pain medication to maintain current level of functioning. Does not receive controlled substance pain medication elsewhere.  Pain meds overall better, handling meds well, some more flare up of lat   Patient notes ongoing compliance with antidepressant medication. No obvious side effects. Reports does not miss a dose. Overall continues to help depression substantially. No thoughts of homicide or suicide. Would like to maintain medication.     Also watching  The    Review of Systems  Constitutional: Negative for activity change, appetite change and fatigue.  HENT: Negative for congestion and rhinorrhea.   Eyes: Negative for discharge.  Respiratory: Negative for cough, chest tightness and wheezing.   Cardiovascular: Negative for chest pain.  Gastrointestinal: Negative for abdominal pain, blood in stool and vomiting.  Endocrine: Negative for polyphagia.  Genitourinary: Negative for difficulty urinating and frequency.  Musculoskeletal: Negative for neck  pain.  Skin: Negative for color change.  Allergic/Immunologic: Negative for environmental allergies and food allergies.  Neurological: Negative for weakness and headaches.  Psychiatric/Behavioral: Negative for agitation and behavioral problems.  All other systems reviewed and are negative.      Objective:   Physical Exam  Constitutional: She is oriented to person, place, and time. She appears well-developed and well-nourished.  HENT:  Head: Normocephalic and atraumatic.  Right Ear: External ear normal.  Left Ear: External ear normal.  Eyes: Right eye exhibits no discharge. Left eye exhibits no discharge.  Neck: Normal range of motion. No tracheal deviation present.  Cardiovascular: Normal rate, regular rhythm, normal heart sounds and intact distal pulses. Exam reveals no gallop.  No murmur heard. Pulmonary/Chest: Effort normal and breath sounds normal. No stridor. No respiratory distress. She has no wheezes. She has no rales.  Abdominal: Soft. Bowel sounds are normal. She exhibits no distension and no mass. There is no tenderness. There is no rebound and no guarding.  Musculoskeletal: Normal range of motion. She exhibits no edema or tenderness.  Lymphadenopathy:    She has no cervical adenopathy.  Neurological: She is alert and oriented to person, place, and time. She exhibits normal muscle tone.  Skin: Skin is warm and dry.  Psychiatric: She has a normal mood and affect. Her behavior is normal.  Vitals reviewed.         Assessment & Plan:  Impression #1 wellness exam.  Preventive GYN issues covered patient's GYN exercise discussed.  Diet discussed.  No health discussed.  Appropriate blood work discussed and recommended we will proceed  2.  Chronic pain ongoing with perhaps more numbness in left leg at this time.  Pain medications  3.  ADHD discussed definite need for meds now get into the past regular active states definitely helps  3.  Depression anxiety ongoing need for  meds meds refilled

## 2017-10-23 MED FILL — AMPHETAMINE-DEXTROAMPHETAMI: 10 | 90 days supply | Qty: 180 | Fill #0

## 2017-10-23 MED FILL — ADDERALL XR 30 MG CAP SA: 30 | 90 days supply | Qty: 90 | Fill #0

## 2017-10-23 MED FILL — HYDROCODON-APAP 5-325: 5-325 | 30 days supply | Qty: 60 | Fill #0

## 2017-10-29 LAB — TOXASSURE SELECT 13 (MW), URINE

## 2017-11-04 DIAGNOSIS — M5033 Other cervical disc degeneration, cervicothoracic region: Secondary | ICD-10-CM | POA: Diagnosis not present

## 2017-11-04 DIAGNOSIS — M9901 Segmental and somatic dysfunction of cervical region: Secondary | ICD-10-CM | POA: Diagnosis not present

## 2017-11-09 DIAGNOSIS — M9901 Segmental and somatic dysfunction of cervical region: Secondary | ICD-10-CM | POA: Diagnosis not present

## 2017-11-09 DIAGNOSIS — M5033 Other cervical disc degeneration, cervicothoracic region: Secondary | ICD-10-CM | POA: Diagnosis not present

## 2017-11-09 MED FILL — BUPROPION SR 150 MG TABLET: 150 | 90 days supply | Qty: 180 | Fill #0

## 2017-11-12 DIAGNOSIS — M9901 Segmental and somatic dysfunction of cervical region: Secondary | ICD-10-CM | POA: Diagnosis not present

## 2017-11-12 DIAGNOSIS — M5033 Other cervical disc degeneration, cervicothoracic region: Secondary | ICD-10-CM | POA: Diagnosis not present

## 2017-11-18 DIAGNOSIS — M9901 Segmental and somatic dysfunction of cervical region: Secondary | ICD-10-CM | POA: Diagnosis not present

## 2017-11-18 DIAGNOSIS — M5033 Other cervical disc degeneration, cervicothoracic region: Secondary | ICD-10-CM | POA: Diagnosis not present

## 2017-11-23 MED FILL — HYDROCODON-APAP 5-325: 5-325 | 30 days supply | Qty: 60 | Fill #0

## 2017-11-23 MED FILL — ALPRAZolam 0.5 MG TABS: 0.5 | 30 days supply | Qty: 30 | Fill #0

## 2017-11-26 DIAGNOSIS — M5033 Other cervical disc degeneration, cervicothoracic region: Secondary | ICD-10-CM | POA: Diagnosis not present

## 2017-11-26 DIAGNOSIS — M9901 Segmental and somatic dysfunction of cervical region: Secondary | ICD-10-CM | POA: Diagnosis not present

## 2017-12-02 DIAGNOSIS — M5033 Other cervical disc degeneration, cervicothoracic region: Secondary | ICD-10-CM | POA: Diagnosis not present

## 2017-12-02 DIAGNOSIS — M9901 Segmental and somatic dysfunction of cervical region: Secondary | ICD-10-CM | POA: Diagnosis not present

## 2017-12-08 DIAGNOSIS — M9901 Segmental and somatic dysfunction of cervical region: Secondary | ICD-10-CM | POA: Diagnosis not present

## 2017-12-08 DIAGNOSIS — M5033 Other cervical disc degeneration, cervicothoracic region: Secondary | ICD-10-CM | POA: Diagnosis not present

## 2017-12-10 DIAGNOSIS — M9901 Segmental and somatic dysfunction of cervical region: Secondary | ICD-10-CM | POA: Diagnosis not present

## 2017-12-10 DIAGNOSIS — M5033 Other cervical disc degeneration, cervicothoracic region: Secondary | ICD-10-CM | POA: Diagnosis not present

## 2017-12-16 DIAGNOSIS — M9901 Segmental and somatic dysfunction of cervical region: Secondary | ICD-10-CM | POA: Diagnosis not present

## 2017-12-16 DIAGNOSIS — M5033 Other cervical disc degeneration, cervicothoracic region: Secondary | ICD-10-CM | POA: Diagnosis not present

## 2017-12-21 DIAGNOSIS — M9901 Segmental and somatic dysfunction of cervical region: Secondary | ICD-10-CM | POA: Diagnosis not present

## 2017-12-21 DIAGNOSIS — M5033 Other cervical disc degeneration, cervicothoracic region: Secondary | ICD-10-CM | POA: Diagnosis not present

## 2017-12-23 MED FILL — HYDROCODON-APAP 5-325: 5-325 | 30 days supply | Qty: 60 | Fill #0

## 2018-01-19 ENCOUNTER — Ambulatory Visit: Payer: 59 | Admitting: Family Medicine

## 2018-02-01 ENCOUNTER — Encounter: Payer: Self-pay | Admitting: Nurse Practitioner

## 2018-02-01 ENCOUNTER — Ambulatory Visit (INDEPENDENT_AMBULATORY_CARE_PROVIDER_SITE_OTHER): Payer: No Typology Code available for payment source | Admitting: Nurse Practitioner

## 2018-02-01 VITALS — BP 142/90 | Ht 60.75 in | Wt 137.0 lb

## 2018-02-01 DIAGNOSIS — F900 Attention-deficit hyperactivity disorder, predominantly inattentive type: Secondary | ICD-10-CM | POA: Diagnosis not present

## 2018-02-01 DIAGNOSIS — Z79891 Long term (current) use of opiate analgesic: Secondary | ICD-10-CM | POA: Diagnosis not present

## 2018-02-01 NOTE — Progress Notes (Signed)
Subjective: This patient was seen today for chronic pain  The medication list was reviewed and updated.   -Compliance with medication: yes  - Number patient states they take daily: only as needed; up to 3 per day if severe  -when was the last dose patient took? A week ago; ran out of med  The patient was advised the importance of maintaining medication and not using illegal substances with these.  Here for refills and follow up  The patient was educated that we can provide 3 monthly scripts for their medication, it is their responsibility to follow the instructions.  Side effects or complications from medications: none   Patient is aware that pain medications are meant to minimize the severity of the pain to allow their pain levels to improve to allow for better function. They are aware of that pain medications cannot totally remove their pain.  Due for UDT ( at least once per year) : UTD Pain is 8-9/10 before pain med and 4-5/10 after on days with severe pain.  PMP reviewed.   Patient was also seen today for ADD checkup. -weight, vital signs reviewed.  The following items were covered. -Compliance with medication : yes  -Problems with completing homework, paying attention/taking good notes in school: no problems  -grades: NA  - Eating patterns : no change  -sleeping: no change; doing well on Wellbutrin and Xanax.   Objective: NAD. Alert, oriented. Lungs clear. Heart RRR.   Assessment:  Problem List Items Addressed This Visit      Other   ADHD (attention deficit hyperactivity disorder) - Primary    Other Visit Diagnoses    Encounter for long-term opiate analgesic use         Plan:  Meds ordered this encounter  Medications  . DISCONTD: HYDROcodone-acetaminophen (NORCO/VICODIN) 5-325 MG tablet    Sig: Take 1 tab up to BID PRN pain    Dispense:  60 tablet    Refill:  0    May fill 60 days after 02/01/18    Order Specific Question:   Supervising Provider   Answer:   Merlyn Albert [[id number]]  . DISCONTD: HYDROcodone-acetaminophen (NORCO/VICODIN) 5-325 MG tablet    Sig: Take 1 tab up to BID PRN pain    Dispense:  60 tablet    Refill:  0    May fill 30 days after 02/01/18    Order Specific Question:   Supervising Provider    Answer:   Merlyn Albert [[id number]]  . DISCONTD: HYDROcodone-acetaminophen (NORCO/VICODIN) 5-325 MG tablet    Sig: Take 1 tab up to BID PRN pain    Dispense:  60 tablet    Refill:  0    Order Specific Question:   Supervising Provider    Answer:   Merlyn Albert [[id number]]  . DISCONTD: HYDROcodone-acetaminophen (NORCO/VICODIN) 5-325 MG tablet    Sig: Take 1 tab up to BID PRN pain    Dispense:  60 tablet    Refill:  0    May fill 30 days from 02/01/18    Order Specific Question:   Supervising Provider    Answer:   Merlyn Albert [[id number]]  . HYDROcodone-acetaminophen (NORCO/VICODIN) 5-325 MG tablet    Sig: Take 1 tab up to BID PRN pain    Dispense:  60 tablet    Refill:  0    May fill 60 days from 02/01/18    Order Specific Question:   Supervising Provider    Answer:  Merlyn AlbertLUKING, WILLIAM S [[id number]]   Will need to check with patient about her Adderall. This has been sent in 90 day supply in the past. Gets regular PE.  Return in about 3 months (around 05/03/2018) for pain management and ADHD.

## 2018-02-02 ENCOUNTER — Encounter: Payer: Self-pay | Admitting: Nurse Practitioner

## 2018-02-02 MED ORDER — HYDROCODONE-ACETAMINOPHEN 5-325 MG PO TABS: ORAL_TABLET | ORAL | 0 refills | Status: DC

## 2018-02-02 MED FILL — HYDROCODON-APAP 5-325: 5-325 | 30 days supply | Qty: 60 | Fill #0

## 2018-02-03 ENCOUNTER — Telehealth: Payer: Self-pay | Admitting: Family Medicine

## 2018-02-03 ENCOUNTER — Other Ambulatory Visit: Payer: Self-pay | Admitting: Nurse Practitioner

## 2018-02-03 MED ORDER — AMPHETAMINE-DEXTROAMPHET ER 30 MG PO CP24: 30.0000 mg | ORAL_CAPSULE | ORAL | 0 refills | Status: DC

## 2018-02-03 MED ORDER — AMPHETAMINE-DEXTROAMPHETAMINE 10 MG PO TABS: ORAL_TABLET | ORAL | 0 refills | Status: DC

## 2018-02-03 MED FILL — ADDERALL XR 30 MG CAP SA: 30 | 30 days supply | Qty: 30 | Fill #0

## 2018-02-03 MED FILL — AMPHETAMINE-DEXTROAMPHETAMI: 10 | 30 days supply | Qty: 60 | Fill #0

## 2018-02-03 NOTE — Telephone Encounter (Signed)
Done. Notified through my chart message.

## 2018-02-03 NOTE — Telephone Encounter (Signed)
Pt notified on voicemail  °

## 2018-02-03 NOTE — Telephone Encounter (Signed)
Patient requesting refill on adderall 30 and adderall 10 for 30 day supply [facility]

## 2018-02-05 ENCOUNTER — Other Ambulatory Visit: Payer: Self-pay | Admitting: Nurse Practitioner

## 2018-02-05 MED ORDER — OSELTAMIVIR PHOSPHATE 75 MG PO CAPS: 75.0000 mg | ORAL_CAPSULE | Freq: Two times a day (BID) | ORAL | 0 refills | Status: DC

## 2018-02-05 MED FILL — OSELTAMIVIR PHOSPHATE 75 MG: 75 | 5 days supply | Qty: 10 | Fill #0

## 2018-02-10 MED FILL — BUPROPION SR 150 MG TABLET: 150 | 90 days supply | Qty: 180 | Fill #1

## 2018-03-03 MED FILL — DEXTROAMP-AMP 10 MG TAB: 10 | 30 days supply | Qty: 60 | Fill #0

## 2018-03-03 MED FILL — HYDROCODON-APAP 5-325: 5-325 | 30 days supply | Qty: 60 | Fill #0

## 2018-03-03 MED FILL — ADDERALL XR 30 MG CAP SA: 30 | 30 days supply | Qty: 30 | Fill #0

## 2018-04-02 ENCOUNTER — Encounter: Payer: Self-pay | Admitting: Nurse Practitioner

## 2018-04-02 ENCOUNTER — Other Ambulatory Visit: Payer: Self-pay | Admitting: Nurse Practitioner

## 2018-04-02 MED FILL — HYDROCODON-APAP 5-325: 5-325 | 30 days supply | Qty: 60 | Fill #0

## 2018-04-05 MED FILL — DEXTROAMP-AMP 10 MG TAB: 10 | 30 days supply | Qty: 60 | Fill #0

## 2018-04-05 MED FILL — ADDERALL XR 30 MG CAP SA: 30 | 30 days supply | Qty: 30 | Fill #0

## 2018-05-03 ENCOUNTER — Ambulatory Visit (INDEPENDENT_AMBULATORY_CARE_PROVIDER_SITE_OTHER): Payer: No Typology Code available for payment source | Admitting: Nurse Practitioner

## 2018-05-03 ENCOUNTER — Encounter: Payer: Self-pay | Admitting: Nurse Practitioner

## 2018-05-03 VITALS — BP 140/90 | Ht 60.75 in | Wt 132.0 lb

## 2018-05-03 DIAGNOSIS — F419 Anxiety disorder, unspecified: Secondary | ICD-10-CM

## 2018-05-03 DIAGNOSIS — F32A Depression, unspecified: Secondary | ICD-10-CM

## 2018-05-03 DIAGNOSIS — Z79891 Long term (current) use of opiate analgesic: Secondary | ICD-10-CM | POA: Insufficient documentation

## 2018-05-03 DIAGNOSIS — G8929 Other chronic pain: Secondary | ICD-10-CM | POA: Diagnosis not present

## 2018-05-03 DIAGNOSIS — M5442 Lumbago with sciatica, left side: Secondary | ICD-10-CM | POA: Diagnosis not present

## 2018-05-03 DIAGNOSIS — F900 Attention-deficit hyperactivity disorder, predominantly inattentive type: Secondary | ICD-10-CM | POA: Diagnosis not present

## 2018-05-03 DIAGNOSIS — M76891 Other specified enthesopathies of right lower limb, excluding foot: Secondary | ICD-10-CM

## 2018-05-03 DIAGNOSIS — F329 Major depressive disorder, single episode, unspecified: Secondary | ICD-10-CM | POA: Diagnosis not present

## 2018-05-03 MED ORDER — HYDROCODONE-ACETAMINOPHEN 5-325 MG PO TABS: ORAL_TABLET | ORAL | 0 refills | Status: DC

## 2018-05-03 MED ORDER — AMPHETAMINE-DEXTROAMPHET ER 30 MG PO CP24: 30.0000 mg | ORAL_CAPSULE | ORAL | 0 refills | Status: DC

## 2018-05-03 MED ORDER — AMPHETAMINE-DEXTROAMPHETAMINE 10 MG PO TABS: ORAL_TABLET | ORAL | 0 refills | Status: DC

## 2018-05-03 MED ORDER — BUPROPION HCL ER (SR) 150 MG PO TB12: 150.0000 mg | ORAL_TABLET | Freq: Two times a day (BID) | ORAL | 1 refills | Status: DC

## 2018-05-03 MED FILL — BUPROPION SR 150 MG TABLET: 150 | 90 days supply | Qty: 180 | Fill #0

## 2018-05-03 MED FILL — AMPHETAMINE-DEXTROAMPHETAMI: 10 | 30 days supply | Qty: 60 | Fill #0

## 2018-05-03 MED FILL — HYDROCODON-APAP 5-325: 5-325 | 30 days supply | Qty: 60 | Fill #0

## 2018-05-03 MED FILL — ADDERALL XR 30 MG CAP SA: 30 | 30 days supply | Qty: 30 | Fill #0

## 2018-05-03 NOTE — Progress Notes (Addendum)
Subjective: Presents for recheck of her ADHD and for pain management.  Usually sleeps well, has had some problems the past couple weeks due to a sick child.  Working night shift which also has decreased her food intake and is contributed to her weight loss.  States she has a good appetite.  No side effects on her current ADHD medication.  Does not take the extra 10 mg every day only when needed depending on how long her shift last.  Took her last dose of pain medicine last Tuesday.  At worst her pain is 8/10, after pain medication 5/10.  This allows her to continue working and performing ADLs.  Also takes ibuprofen and sees a chiropractor for her back pain.  Continues to be mainly in her left side going into the left low back area and causing sciatic pain at times.  Denies any side effects from the medication.  Has noticed more stiffness and pain in her right hip lately, no history of injury.  Anxiety and depression stable on Wellbutrin, needs a refill.  Objective:   BP 140/90   Ht 5' 0.75" (1.543 m)   Wt 132 lb (59.9 kg)   BMI 25.15 kg/m  NAD.  Alert, oriented.  Lungs clear.  Heart regular rate rhythm.  Minimal tenderness in the lumbar area.  SLR negative bilaterally.  Very limited rotation of the right hip particularly external rotation, very tight.  Also tenderness with palpation of the right hip joint area.  Gait normal limit.  Assessment:   Problem List Items Addressed This Visit      Other   ADHD (attention deficit hyperactivity disorder) - Primary   Chronic back pain   Relevant Medications   HYDROcodone-acetaminophen (NORCO/VICODIN) 5-325 MG tablet   Encounter for long-term opiate analgesic use    Other Visit Diagnoses    Tendinitis of right hip           Plan:   Meds ordered this encounter  Medications  . buPROPion (WELLBUTRIN SR) 150 MG 12 hr tablet    Sig: Take 1 tablet (150 mg total) by mouth 2 (two) times daily.    Dispense:  180 tablet    Refill:  1    Order Specific  Question:   Supervising Provider    Answer:   Merlyn Albert [[id number]]  . DISCONTD: HYDROcodone-acetaminophen (NORCO/VICODIN) 5-325 MG tablet    Sig: Take 1 tab up to BID PRN pain    Dispense:  60 tablet    Refill:  0    May fill 60 days from 05/03/18    Order Specific Question:   Supervising Provider    Answer:   Merlyn Albert [[id number]]  . DISCONTD: amphetamine-dextroamphetamine (ADDERALL XR) 30 MG 24 hr capsule    Sig: Take 1 capsule (30 mg total) by mouth every morning.    Dispense:  30 capsule    Refill:  0    May fill 60 days from 05/03/18    Order Specific Question:   Supervising Provider    Answer:   Merlyn Albert [[id number]]  . DISCONTD: amphetamine-dextroamphetamine (ADDERALL) 10 MG tablet    Sig: One to two tablets every afternoon    Dispense:  60 tablet    Refill:  0    May fill 60 days from 05/03/18    Order Specific Question:   Supervising Provider    Answer:   Merlyn Albert [[id number]]  . DISCONTD: amphetamine-dextroamphetamine (ADDERALL XR) 30 MG 24 hr capsule  Sig: Take 1 capsule (30 mg total) by mouth every morning.    Dispense:  30 capsule    Refill:  0    May fill 30 days from 05/03/18    Order Specific Question:   Supervising Provider    Answer:   Merlyn AlbertLUKING, WILLIAM S [[id number]]  . DISCONTD: amphetamine-dextroamphetamine (ADDERALL) 10 MG tablet    Sig: One to two tablets every afternoon    Dispense:  60 tablet    Refill:  0    May fill 30 days from 05/03/18    Order Specific Question:   Supervising Provider    Answer:   Merlyn AlbertLUKING, WILLIAM S [[id number]]  . DISCONTD: HYDROcodone-acetaminophen (NORCO/VICODIN) 5-325 MG tablet    Sig: Take 1 tab up to BID PRN pain    Dispense:  60 tablet    Refill:  0    May fill 30 days from 05/03/18    Order Specific Question:   Supervising Provider    Answer:   Merlyn AlbertLUKING, WILLIAM S [[id number]]  . amphetamine-dextroamphetamine (ADDERALL XR) 30 MG 24 hr capsule    Sig: Take 1 capsule (30 mg total) by mouth every morning.    Dispense:  30 capsule     Refill:  0    Order Specific Question:   Supervising Provider    Answer:   Merlyn AlbertLUKING, WILLIAM S [[id number]]  . amphetamine-dextroamphetamine (ADDERALL) 10 MG tablet    Sig: One to two tablets every afternoon    Dispense:  60 tablet    Refill:  0    Order Specific Question:   Supervising Provider    Answer:   Merlyn AlbertLUKING, WILLIAM S [[id number]]  . HYDROcodone-acetaminophen (NORCO/VICODIN) 5-325 MG tablet    Sig: Take 1 tab up to BID PRN pain    Dispense:  60 tablet    Refill:  0    Order Specific Question:   Supervising Provider    Answer:   Riccardo DubinLUKING, WILLIAM S [[id number]]  PMP reviewed.  Continue current medications as directed.  Encouraged anti-inflammatories stretching exercises and chiropractic care for back and hip problems.  Discussed risk associated with taking benzodiazepine and opiate pain medicine, not to take those within 4 hours of each other. Return in about 3 months (around 08/03/2018) for pain management and ADHD. 25 minutes was spent with the patient.  This statement verifies that 25 minutes was indeed spent with the patient.  More than 50% of this visit-total duration of the visit-was spent in counseling and coordination of care. The issues that the patient came in for today as reflected in the diagnosis (s) please refer to documentation for further details.

## 2018-06-03 MED FILL — HYDROCODON-APAP 5-325: 5-325 | 30 days supply | Qty: 60 | Fill #0

## 2018-06-03 MED FILL — AMPHETAMINE-DEXTROAMPHETAMI: 10 | 30 days supply | Qty: 60 | Fill #0

## 2018-06-03 MED FILL — ADDERALL XR 30 MG CAP SA: 30 | 30 days supply | Qty: 30 | Fill #0

## 2018-07-01 ENCOUNTER — Other Ambulatory Visit: Payer: Self-pay

## 2018-07-01 MED ORDER — AMPHETAMINE-DEXTROAMPHETAMINE 10 MG PO TABS: ORAL_TABLET | ORAL | 0 refills | Status: DC

## 2018-07-01 MED ORDER — AMPHETAMINE-DEXTROAMPHET ER 30 MG PO CP24: 30.0000 mg | ORAL_CAPSULE | ORAL | 0 refills | Status: DC

## 2018-07-01 MED ORDER — HYDROCODONE-ACETAMINOPHEN 5-325 MG PO TABS: ORAL_TABLET | ORAL | 0 refills | Status: DC

## 2018-07-01 NOTE — Telephone Encounter (Signed)
Can get early if pharm allows, will need to make last until next reg time to fill

## 2018-07-01 NOTE — Telephone Encounter (Signed)
Contacted patient to let her know she would need to come pick these scripts up; pt stated she already had [facility] and just needed doctor approval. Monica Linksontacted [facility] and informed them that Dr.Steve stated patient could get these meds filled early if pharmacy allows. Pharmacist stated she could fill these tomorrow. Contacted patient and informed her. Pt verbalized understanding.

## 2018-07-02 MED FILL — HYDROCODON-APAP 5-325: 5-325 | 30 days supply | Qty: 60 | Fill #0

## 2018-07-02 MED FILL — ADDERALL XR 30 MG CAP SA: 30 | 30 days supply | Qty: 30 | Fill #0

## 2018-07-02 MED FILL — AMPHETAMINE-DEXTROAMPHETAMI: 10 | 30 days supply | Qty: 60 | Fill #0

## 2018-08-03 ENCOUNTER — Ambulatory Visit (INDEPENDENT_AMBULATORY_CARE_PROVIDER_SITE_OTHER): Payer: No Typology Code available for payment source | Admitting: Family Medicine

## 2018-08-03 ENCOUNTER — Encounter: Payer: Self-pay | Admitting: Family Medicine

## 2018-08-03 VITALS — BP 138/88 | Ht 60.75 in | Wt 135.0 lb

## 2018-08-03 DIAGNOSIS — M5440 Lumbago with sciatica, unspecified side: Secondary | ICD-10-CM | POA: Diagnosis not present

## 2018-08-03 DIAGNOSIS — F32A Depression, unspecified: Secondary | ICD-10-CM

## 2018-08-03 DIAGNOSIS — F900 Attention-deficit hyperactivity disorder, predominantly inattentive type: Secondary | ICD-10-CM

## 2018-08-03 DIAGNOSIS — M5442 Lumbago with sciatica, left side: Secondary | ICD-10-CM | POA: Diagnosis not present

## 2018-08-03 DIAGNOSIS — G8929 Other chronic pain: Secondary | ICD-10-CM

## 2018-08-03 DIAGNOSIS — F329 Major depressive disorder, single episode, unspecified: Secondary | ICD-10-CM

## 2018-08-03 DIAGNOSIS — F419 Anxiety disorder, unspecified: Secondary | ICD-10-CM

## 2018-08-03 MED ORDER — HYDROCODONE-ACETAMINOPHEN 5-325 MG PO TABS: ORAL_TABLET | ORAL | 0 refills | Status: DC

## 2018-08-03 MED ORDER — AMPHETAMINE-DEXTROAMPHETAMINE 10 MG PO TABS: ORAL_TABLET | ORAL | 0 refills | Status: DC

## 2018-08-03 MED ORDER — AMPHETAMINE-DEXTROAMPHET ER 30 MG PO CP24: 30.0000 mg | ORAL_CAPSULE | ORAL | 0 refills | Status: DC

## 2018-08-03 MED ORDER — ALPRAZOLAM 0.5 MG PO TABS: 0.5000 mg | ORAL_TABLET | Freq: Every evening | ORAL | 5 refills | Status: DC | PRN

## 2018-08-03 MED FILL — HYDROCODON-APAP 5-325: 5-325 | 30 days supply | Qty: 60 | Fill #0

## 2018-08-03 MED FILL — AMPHETAMINE SALTS 10 MG TAB: 10 | 90 days supply | Qty: 180 | Fill #0

## 2018-08-03 MED FILL — ADDERALL XR 30 MG CAP SA: 30 | 90 days supply | Qty: 90 | Fill #0

## 2018-08-03 MED FILL — ALPRAZolam 0.5 MG TABS: 0.5 | 45 days supply | Qty: 45 | Fill #0

## 2018-08-03 MED FILL — BUPROPION SR 150 MG TABLET: 150 | 90 days supply | Qty: 180 | Fill #1

## 2018-08-03 NOTE — Progress Notes (Signed)
   Subjective:    Patient ID: Monica Mahoney, female    DOB: 12-May-1981, 37 y.o.   MRN: [id number]  HPI  Patient is here today to follow up on her chronic health issues and her Adhd. Patient was seen today for ADD checkup.  This patient does have ADD.  Patient takes medications for this.  If this does help control overall symptoms.  Please see below. -weight, vital signs reviewed.  The following items were covered. -Compliance with medication : Yes  -Problems with completing homework, paying attention/taking good notes in school:  No  -grades: NA  - Eating patterns : Good  -sleeping: Good  -Additional issues or questions: None  workingnights overall doing better   eerciing reg  Two to thre times    Patient compliant with pain medication. Continues to experience the pain which led to initiation of analgesic intervention. No significant negative side effects. States definitely needs the pain medication to maintain current level of functioning. Does not receive controlled substance pain medication elsewhere.    fociusing olding its own.  Ongoing need for ADHD meds.  Without is not functioning well.    Low bk pain feels deeper, some days having flare and takes more than othes pain is primarily left lumbar region.  Minimal radiation.  Fairly severe at times.  Some days needs several tablets some days none.  Review of Systems No headache, no major weight loss or weight gain, no chest pain abdominal pain no change in bowel habits complete ROS otherwise negative     Objective:   Physical Exam  Alert and oriented, vitals reviewed and stable, NAD ENT-TM's and ext canals WNL bilat via otoscopic exam Soft palate, tonsils and post pharynx WNL via oropharyngeal exam Neck-symmetric, no masses; thyroid nonpalpable and nontender Pulmonary-no tachypnea or accessory muscle use; Clear without wheezes via auscultation Card--no abnrml murmurs, rhythm reg and rate WNL Carotid pulses  symmetric, without bruits Positive left lower lumbar tenderness to deep palpation.      Assessment & Plan:  Impression 1 chronic ADHD.  Ongoing need for meds.  Side effects benefits discussed to maintain same dose  2.  Generalized anxiety disorder.  With comorbidity element of depression also ongoing.  Some recent flare evident.  She wishes to stay on same dose of medications which is reasonable  3.  Chronic back pain  Impression: Chronic pain. Patient compliant with medication. No substantial side effects. [place] controlled substance registry reviewed to ensure compliance and proper use of medication. Patient aware goal of medicine is not complete resolution of pain but to control his symptoms to improve his functional capacity. Aware of potential adverse side effects  Follow-up in 3 months.  Exercise also discussed medication refill

## 2018-09-02 MED FILL — HYDROCODON-APAP 5-325: 5-325 | 30 days supply | Qty: 60 | Fill #0

## 2018-10-05 MED FILL — HYDROCODON-APAP 5-325: 5-325 | 60 days supply | Qty: 60 | Fill #0

## 2018-11-01 ENCOUNTER — Encounter: Payer: Self-pay | Admitting: Family Medicine

## 2018-11-01 ENCOUNTER — Ambulatory Visit (INDEPENDENT_AMBULATORY_CARE_PROVIDER_SITE_OTHER): Payer: No Typology Code available for payment source | Admitting: Family Medicine

## 2018-11-01 VITALS — BP 128/82 | Ht 60.75 in | Wt 136.6 lb

## 2018-11-01 DIAGNOSIS — Z79891 Long term (current) use of opiate analgesic: Secondary | ICD-10-CM

## 2018-11-01 DIAGNOSIS — F900 Attention-deficit hyperactivity disorder, predominantly inattentive type: Secondary | ICD-10-CM | POA: Diagnosis not present

## 2018-11-01 DIAGNOSIS — F329 Major depressive disorder, single episode, unspecified: Secondary | ICD-10-CM

## 2018-11-01 DIAGNOSIS — M545 Low back pain: Secondary | ICD-10-CM

## 2018-11-01 DIAGNOSIS — F32A Depression, unspecified: Secondary | ICD-10-CM

## 2018-11-01 DIAGNOSIS — G8929 Other chronic pain: Secondary | ICD-10-CM

## 2018-11-01 DIAGNOSIS — F419 Anxiety disorder, unspecified: Secondary | ICD-10-CM | POA: Diagnosis not present

## 2018-11-01 MED ORDER — AMPHETAMINE-DEXTROAMPHETAMINE 10 MG PO TABS: ORAL_TABLET | ORAL | 0 refills | Status: DC

## 2018-11-01 MED ORDER — BUPROPION HCL ER (SR) 150 MG PO TB12: 150.0000 mg | ORAL_TABLET | Freq: Two times a day (BID) | ORAL | 1 refills | Status: DC

## 2018-11-01 MED ORDER — AMPHETAMINE-DEXTROAMPHET ER 30 MG PO CP24: 30.0000 mg | ORAL_CAPSULE | ORAL | 0 refills | Status: DC

## 2018-11-01 MED ORDER — HYDROCODONE-ACETAMINOPHEN 5-325 MG PO TABS: ORAL_TABLET | ORAL | 0 refills | Status: DC

## 2018-11-01 MED ORDER — AZITHROMYCIN 250 MG PO TABS: ORAL_TABLET | ORAL | 0 refills | Status: DC

## 2018-11-01 MED FILL — ALPRAZolam 0.5 MG TABS: 0.5 | 45 days supply | Qty: 45 | Fill #1

## 2018-11-01 MED FILL — AZITHROMYCIN 250 MG TABS: 250 | 5 days supply | Qty: 6 | Fill #0

## 2018-11-01 MED FILL — BUPROPION SR 150 MG TABLET: 150 | 90 days supply | Qty: 180 | Fill #0

## 2018-11-01 MED FILL — ADDERALL XR 30 MG CAP SA: 30 | 90 days supply | Qty: 90 | Fill #0

## 2018-11-01 MED FILL — AMPHETAMINE-DEXTROAMPHETAMI: 10 | 90 days supply | Qty: 180 | Fill #0

## 2018-11-01 NOTE — Progress Notes (Signed)
   Subjective:    Patient ID: Monica Mahoney, female    DOB: 11-19-1980, 37 y.o.   MRN: [id number] Patient arrives with numerous concerns. HPI Patient arrives for a follow up on ADHD. Patient currently on Adderall xr 30mg  in am and Adderall 10mg  in the afternoon. Patient reports no problems or concerns.  Using extra adhd meds     Patient notes ongoing compliance with antidepressant medication. No obvious side effects. Reports does not miss a dose. Overall continues to help depression substantially. No thoughts of homicide or suicide. Would like to maintain medication.  Exercise not so good, hoiping to work on it  Impression: Chronic pain. Patient compliant with medication. No substantial side effects. [place] controlled substance registry reviewed to ensure compliance and proper use of medication. Patient aware goal of medicine is not complete resolution of pain but to control his symptoms to improve his functional capacity. Aware of potential adverse side effects   Patient compliant with pain medication. Continues to experience the pain which led to initiation of analgesic intervention. No significant negative side effects. States definitely needs the pain medication to maintain current level of functioning. Does not receive controlled substance pain medication elsewhere.   baack pain overall flaring u , giving pt challenges   Often gets worse with the wether   More hip pain  Worse in the left hip  Pt noted also more upper back pain and headaches   Pt has neuropathic radiation of the pain into the left foot, using lidocaine patches  Overall feels pretty good   Allotment of pain meds ok  .  aleave prn helps   Patient notes ongoing compliance with antidepressant medication. No obvious side effects. Reports does not miss a dose. Overall continues to help depression substantially. No thoughts of homicide or suicide. Would like to maintain medication.   Morning cough  for the past   Going on for about a month   Notes diminshed energy with it       Review of Systems No headache, no major weight loss or weight gain, no chest pain no back pain abdominal pain no change in bowel habits complete ROS otherwise negative     Objective:   Physical Exam  Alert and oriented, vitals reviewed and stable, NAD ENT-TM's and ext canals WNL bilat via otoscopic exam Soft palate, tonsils and post pharynx WNL via oropharyngeal exam Neck-symmetric, no masses; thyroid nonpalpable and nontender Pulmonary-no tachypnea or accessory muscle use; Clear without wheezes via auscultation Card--no abnrml murmurs, rhythm reg and rate WNL Carotid pulses symmetric, without bruits       Assessment & Plan:  Impression: Chronic pain. Patient compliant with medication. No substantial side effects. [place] controlled substance registry reviewed to ensure compliance and proper use of medication. Patient aware goal of medicine is not complete resolution of pain but to control his symptoms to improve his functional capacity. Aware of potential adverse side effects  #2 ADHD.  Ongoing.  With definite need for medications.  Meds refilled  3.  Subacute bronchitis.  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  Generalized anxiety disorder with element of depression.  Clinically stable will maintain same meds

## 2018-12-01 MED FILL — HYDROCODON-APAP 5-325: 5-325 | 30 days supply | Qty: 60 | Fill #0

## 2019-01-03 MED FILL — HYDROCODON-APAP 5-325: 5-325 | 30 days supply | Qty: 60 | Fill #0

## 2019-01-03 MED FILL — ALPRAZolam 0.5 MG TABS: 0.5 | 45 days supply | Qty: 45 | Fill #2

## 2019-01-19 ENCOUNTER — Encounter: Payer: Self-pay | Admitting: Family Medicine

## 2019-01-19 NOTE — Telephone Encounter (Signed)
Yes if you can move that appointment up that would be great, just give [place] a call to set up.Thanks.

## 2019-01-24 ENCOUNTER — Ambulatory Visit (INDEPENDENT_AMBULATORY_CARE_PROVIDER_SITE_OTHER): Payer: No Typology Code available for payment source | Admitting: Family Medicine

## 2019-01-24 ENCOUNTER — Encounter: Payer: Self-pay | Admitting: Family Medicine

## 2019-01-24 ENCOUNTER — Other Ambulatory Visit: Payer: Self-pay

## 2019-01-24 DIAGNOSIS — F900 Attention-deficit hyperactivity disorder, predominantly inattentive type: Secondary | ICD-10-CM

## 2019-01-24 DIAGNOSIS — F419 Anxiety disorder, unspecified: Secondary | ICD-10-CM

## 2019-01-24 DIAGNOSIS — F329 Major depressive disorder, single episode, unspecified: Secondary | ICD-10-CM | POA: Diagnosis not present

## 2019-01-24 DIAGNOSIS — Z79891 Long term (current) use of opiate analgesic: Secondary | ICD-10-CM | POA: Diagnosis not present

## 2019-01-24 DIAGNOSIS — F32A Depression, unspecified: Secondary | ICD-10-CM

## 2019-01-24 MED ORDER — BUPROPION HCL ER (SR) 150 MG PO TB12: 150.0000 mg | ORAL_TABLET | Freq: Two times a day (BID) | ORAL | 1 refills | Status: DC

## 2019-01-24 MED ORDER — HYDROCODONE-ACETAMINOPHEN 5-325 MG PO TABS: ORAL_TABLET | ORAL | 0 refills | Status: DC

## 2019-01-24 MED ORDER — ALPRAZOLAM 0.5 MG PO TABS: 0.5000 mg | ORAL_TABLET | Freq: Every evening | ORAL | 5 refills | Status: DC | PRN

## 2019-01-24 MED ORDER — AMPHETAMINE-DEXTROAMPHET ER 30 MG PO CP24: 30.0000 mg | ORAL_CAPSULE | ORAL | 0 refills | Status: DC

## 2019-01-24 MED ORDER — AMPHETAMINE-DEXTROAMPHETAMINE 10 MG PO TABS: ORAL_TABLET | ORAL | 0 refills | Status: DC

## 2019-01-24 NOTE — Progress Notes (Signed)
   Subjective:    Patient ID: Monica Mahoney, female    DOB: December 12, 1980, 38 y.o.   MRN: [id number]  HPI  Patient arrives for a follow up on ADHD medication. Patient states she is doing well on current medication. Patient reports no problems or concerns. Patient would also like a refill of her hydrocodone- patient states she takes it a couple nights a week if needed- not everyday.  Patient compliant with pain medication. Continues to experience the pain which led to initiation of analgesic intervention. No significant negative side effects. States definitely needs the pain medication to maintain current level of functioning. Does not receive controlled substance pain medication elsewhere.  Patient notes ongoing compliance with antidepressant medication. No obvious side effects. Reports does not miss a dose. Overall continues to help depression substantially. No thoughts of homicide or suicide. Would like to maintain medication.   Review of Systems No headache, no major weight loss or weight gain, no chest pain no back pain abdominal pain no change in bowel habits complete ROS otherwise negative     Objective:   Physical Exam   Alert and oriented, vitals reviewed and stable, NAD ENT-TM's and ext canals WNL bilat via otoscopic exam Soft palate, tonsils and post pharynx WNL via oropharyngeal exam Neck-symmetric, no masses; thyroid nonpalpable and nontender Pulmonary-no tachypnea or accessory muscle use; Clear without wheezes via auscultation Card--no abnrml murmurs, rhythm reg and rate WNL Carotid pulses symmetric, without bruits      Assessment & Plan:  Impression 1 depression anxiety clinically stable discussed maintain same  2.  ADHD.  Ongoing need for meds meds refilled  3.  Chronic pain  Impression: Chronic pain. Patient compliant with medication. No substantial side effects. [place] controlled substance registry reviewed to ensure compliance and proper use of  medication. Patient aware goal of medicine is not complete resolution of pain but to control his symptoms to improve his functional capacity. Aware of potential adverse side effects   Patient's filled out diet exercise discussed follow-up in several months

## 2019-01-25 MED ORDER — AMPHETAMINE-DEXTROAMPHET ER 30 MG PO CP24: 30.0000 mg | ORAL_CAPSULE | ORAL | 0 refills | Status: DC

## 2019-01-25 MED ORDER — HYDROCODONE-ACETAMINOPHEN 5-325 MG PO TABS: ORAL_TABLET | ORAL | 0 refills | Status: DC

## 2019-01-25 MED ORDER — ALPRAZOLAM 0.5 MG PO TABS: 0.5000 mg | ORAL_TABLET | Freq: Every evening | ORAL | 5 refills | Status: DC | PRN

## 2019-01-25 MED ORDER — AMPHETAMINE-DEXTROAMPHETAMINE 10 MG PO TABS: ORAL_TABLET | ORAL | 0 refills | Status: DC

## 2019-01-25 MED FILL — AMPHETAMINE-DEXTROAMPHETAMI: 10 | 90 days supply | Qty: 180 | Fill #0

## 2019-01-25 MED FILL — ADDERALL XR 30 MG CAP SA: 30 | 90 days supply | Qty: 90 | Fill #0

## 2019-01-25 MED FILL — BUPROPION HCL SR 150 MG TAB: 150 | 80 days supply | Qty: 160 | Fill #0

## 2019-01-25 NOTE — Telephone Encounter (Signed)
I guess we got to re do all

## 2019-01-25 NOTE — Telephone Encounter (Signed)
meds pended

## 2019-01-27 MED FILL — HYDROCODON-APAP 5-325: 5-325 | 30 days supply | Qty: 60 | Fill #0

## 2019-01-31 ENCOUNTER — Ambulatory Visit: Payer: No Typology Code available for payment source | Admitting: Family Medicine

## 2019-02-28 MED FILL — ALPRAZolam 0.5 MG TABS: 0.5 | 45 days supply | Qty: 45 | Fill #0

## 2019-02-28 MED FILL — HYDROCODON-APAP 5-325: 5-325 | 30 days supply | Qty: 60 | Fill #0

## 2019-03-26 MED FILL — HYDROCODON-APAP 5-325: 5-325 | 30 days supply | Qty: 60 | Fill #0

## 2019-04-12 ENCOUNTER — Ambulatory Visit (INDEPENDENT_AMBULATORY_CARE_PROVIDER_SITE_OTHER): Payer: No Typology Code available for payment source | Admitting: Family Medicine

## 2019-04-12 ENCOUNTER — Other Ambulatory Visit: Payer: Self-pay

## 2019-04-12 ENCOUNTER — Encounter: Payer: Self-pay | Admitting: Family Medicine

## 2019-04-12 DIAGNOSIS — M545 Low back pain, unspecified: Secondary | ICD-10-CM

## 2019-04-12 DIAGNOSIS — F900 Attention-deficit hyperactivity disorder, predominantly inattentive type: Secondary | ICD-10-CM

## 2019-04-12 DIAGNOSIS — F329 Major depressive disorder, single episode, unspecified: Secondary | ICD-10-CM | POA: Diagnosis not present

## 2019-04-12 DIAGNOSIS — F32A Depression, unspecified: Secondary | ICD-10-CM

## 2019-04-12 DIAGNOSIS — F419 Anxiety disorder, unspecified: Secondary | ICD-10-CM

## 2019-04-12 DIAGNOSIS — G8929 Other chronic pain: Secondary | ICD-10-CM

## 2019-04-12 MED ORDER — AMPHETAMINE-DEXTROAMPHETAMINE 10 MG PO TABS: ORAL_TABLET | ORAL | 0 refills | Status: DC

## 2019-04-12 MED ORDER — HYDROCODONE-ACETAMINOPHEN 5-325 MG PO TABS: ORAL_TABLET | ORAL | 0 refills | Status: DC

## 2019-04-12 MED ORDER — BUPROPION HCL ER (SR) 150 MG PO TB12: 150.0000 mg | ORAL_TABLET | Freq: Two times a day (BID) | ORAL | 1 refills | Status: DC

## 2019-04-12 MED ORDER — AMPHETAMINE-DEXTROAMPHET ER 30 MG PO CP24: 30.0000 mg | ORAL_CAPSULE | ORAL | 0 refills | Status: DC

## 2019-04-12 MED FILL — BUPROPION HCL SR 150 MG TAB: 150 | 90 days supply | Qty: 180 | Fill #0

## 2019-04-12 NOTE — Progress Notes (Signed)
   Subjective:    Patient ID: Monica Mahoney, female    DOB: 10-16-1981, 38 y.o.   MRN: [id number] Audio only HPI This patient was seen today for chronic pain. Headaches and hip pain.   The medication list was reviewed and updated.   -Compliance with medication: has been taking 2 a day but sometimes has to take 3 a day.   - Number patient states they take daily: some days 3 per day  -when was the last dose patient took? 2 days ago  The patient was advised the importance of maintaining medication and not using illegal substances with these.  Here for refills and follow up  The patient was educated that we can provide 3 monthly scripts for their medication, it is their responsibility to follow the instructions.  Side effects or complications from medications: none  Patient is aware that pain medications are meant to minimize the severity of the pain to allow their pain levels to improve to allow for better function. They are aware of that pain medications cannot totally remove their pain.  Due for UDT ( at least once per year) : over one year ago 10/22/17.  Needs refills on adderall xr 30mg  and adderall 10mg .  Virtual Visit via Video Note  I connected with Monica Mahoney on 04/12/19 at  2:00 PM EDT by a video enabled telemedicine application and verified that I am speaking with the correct person using two identifiers.  Location: Patient: home Provider: office   I discussed the limitations of evaluation and management by telemedicine and the availability of in person appointments. The patient expressed understanding and agreed to proceed.  History of Present Illness:    Observations/Objective:   Assessment and Plan:   Follow Up Instructions:    I discussed the assessment and treatment plan with the patient. The patient was provided an opportunity to ask questions and all were answered. The patient agreed with the plan and demonstrated an understanding of the instructions.    The patient was advised to call back or seek an in-person evaluation if the symptoms worsen or if the condition fails to improve as anticipated.  I provided 25 minutes of non-face-to-face time during this encounter.            Review of Systems No headache, no major weight loss or weight gain, no chest pain no back pain abdominal pain no change in bowel habits complete ROS otherwise negative     Objective:   Physical Exam  Virtual      Assessment & Plan:  Impression 1 chronic pain.  Worsening per patient.  Needs some days 3 pain pills to adequately control pain.  No major side effects.  2.  ADHD.  Ongoing with need for meds meds refilled compliance discussed  3.  Generalized anxiety disorder with element depression.  Clinically stable.  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

## 2019-04-20 MED FILL — HYDROCODON-APAP 5-325: 5-325 | 25 days supply | Qty: 75 | Fill #0

## 2019-04-20 MED FILL — ADDERALL XR 30 MG CAP SA: 30 | 90 days supply | Qty: 90 | Fill #0

## 2019-04-20 MED FILL — ALPRAZolam 0.5 MG TABS: 0.5 | 45 days supply | Qty: 45 | Fill #1

## 2019-04-20 MED FILL — AMPHETAMINE-DEXTROAMPHETAMI: 10 | 90 days supply | Qty: 180 | Fill #0

## 2019-05-13 MED FILL — HYDROCODON-APAP 5-325: 5-325 | 25 days supply | Qty: 75 | Fill #0

## 2019-06-05 MED FILL — ALPRAZolam 0.5 MG TABS: 0.5 | 45 days supply | Qty: 45 | Fill #2

## 2019-06-11 MED FILL — HYDROCODON-APAP 5-325: 5-325 | 25 days supply | Qty: 75 | Fill #0

## 2019-07-05 ENCOUNTER — Encounter: Payer: Self-pay | Admitting: Family Medicine

## 2019-07-07 MED FILL — BUPROPION HCL SR 150 MG TAB: 150 | 90 days supply | Qty: 180 | Fill #1

## 2019-07-12 ENCOUNTER — Other Ambulatory Visit: Payer: Self-pay | Admitting: *Deleted

## 2019-07-12 ENCOUNTER — Encounter: Payer: Self-pay | Admitting: Family Medicine

## 2019-07-12 ENCOUNTER — Ambulatory Visit (INDEPENDENT_AMBULATORY_CARE_PROVIDER_SITE_OTHER): Payer: No Typology Code available for payment source | Admitting: Family Medicine

## 2019-07-12 ENCOUNTER — Other Ambulatory Visit: Payer: Self-pay

## 2019-07-12 DIAGNOSIS — F329 Major depressive disorder, single episode, unspecified: Secondary | ICD-10-CM | POA: Diagnosis not present

## 2019-07-12 DIAGNOSIS — G8929 Other chronic pain: Secondary | ICD-10-CM

## 2019-07-12 DIAGNOSIS — F900 Attention-deficit hyperactivity disorder, predominantly inattentive type: Secondary | ICD-10-CM

## 2019-07-12 DIAGNOSIS — M5440 Lumbago with sciatica, unspecified side: Secondary | ICD-10-CM | POA: Diagnosis not present

## 2019-07-12 DIAGNOSIS — F32A Depression, unspecified: Secondary | ICD-10-CM

## 2019-07-12 DIAGNOSIS — F419 Anxiety disorder, unspecified: Secondary | ICD-10-CM

## 2019-07-12 MED ORDER — HYDROCODONE-ACETAMINOPHEN 5-325 MG PO TABS: ORAL_TABLET | ORAL | 0 refills | Status: DC

## 2019-07-12 MED ORDER — AMPHETAMINE-DEXTROAMPHETAMINE 10 MG PO TABS: ORAL_TABLET | ORAL | 0 refills | Status: DC

## 2019-07-12 MED ORDER — PREDNISONE 20 MG PO TABS: ORAL_TABLET | ORAL | 0 refills | Status: DC

## 2019-07-12 MED ORDER — AMPHETAMINE-DEXTROAMPHET ER 30 MG PO CP24: 30.0000 mg | ORAL_CAPSULE | ORAL | 0 refills | Status: DC

## 2019-07-12 MED ORDER — ALPRAZOLAM 0.5 MG PO TABS: 0.5000 mg | ORAL_TABLET | Freq: Every evening | ORAL | 5 refills | Status: DC | PRN

## 2019-07-12 MED FILL — HYDROCODON-APAP 5-325: 5-325 | 30 days supply | Qty: 90 | Fill #0

## 2019-07-12 MED FILL — predniSONE 20 MG TABS: 20 | 9 days supply | Qty: 18 | Fill #0

## 2019-07-12 NOTE — Progress Notes (Signed)
   Subjective:  Audio plus video  Patient ID: Monica Mahoney, female    DOB: 03/04/1981, 38 y.o.   MRN: [id number] Patient now seen for multiple reasons. HPI  Patient calls for a follow up on ADHD.  Handles the medication well.  No obvious side effects.  Without it has difficulty focusing and attention.  Definitely needs to stay on the medication.  Patient states she is doing well on current meds and has no problems or concerns  Patient does report ongoing back pain.  In fact the pain is worsening.  Left leg quite painful.  More numbness.  More discomfort.  Deep aching pain associated with burning sharp at times.  Having to work longer hours and standing on her feet mostly  Patient concerned about the worsening of her leg.  Feels her neuropathic pain is getting worse.  The last time it was this bad she had injections which helped her considerably  Patient reports anxiety and depression is clinically stable.  Compliant with medications.  Definitely receives benefit from does not overuse.  Virtual Visit via Video Note  I connected with Monica Mahoney on 07/12/19 at  8:30 AM EDT by a video enabled telemedicine application and verified that I am speaking with the correct person using two identifiers.  Location: Patient: home Provider: office   I discussed the limitations of evaluation and management by telemedicine and the availability of in person appointments. The patient expressed understanding and agreed to proceed.  History of Present Illness:    Observations/Objective:   Assessment and Plan:   Follow Up Instructions:    I discussed the assessment and treatment plan with the patient. The patient was provided an opportunity to ask questions and all were answered. The patient agreed with the plan and demonstrated an understanding of the instructions.   The patient was advised to call back or seek an in-person evaluation if the symptoms worsen or if the condition fails to improve as  anticipated.  I provide 25 minutes of non-face-to-face time during this encounter.     Review of Systems No headache, no major weight loss or weight gain, no chest pain  abdominal pain no change in bowel habits complete ROS otherwise negative     Objective:   Physical Exam   Virtual     Assessment & Plan:  Impression 1 chronic neuropathic pain.  Worsening.  Will increase hydrocodone allotment 90/month.  We will do a steroid taper due to acute neuropathic features.  Referral back to [place] [facility] hopefully for injections which will help  2.  ADHD good control discussed compliance discussed medications refilled.  3.  Chronic anxiety ongoing substantial.  Somewhat worse recently with stressful work situation.  Exercise encouraged.  Medications maintain  Greater than 50% of this 25 minute none face to face visit was spent in counseling and discussion and coordination of care regarding the above diagnosis/diagnosies

## 2019-07-13 ENCOUNTER — Telehealth: Payer: Self-pay | Admitting: Family Medicine

## 2019-07-13 DIAGNOSIS — G8929 Other chronic pain: Secondary | ICD-10-CM

## 2019-07-13 NOTE — Telephone Encounter (Signed)
Let pt know they are wanting a lumbar mri first since so long since last, we can try to order but insur co getting a lot more grumpy about authorizing them, plz order. Often insur co demands 8 weeks of incr pain and failure with oral steroids before allowing

## 2019-07-13 NOTE — Telephone Encounter (Signed)
Pt will need MRI or CT to send with referral to neurosurgery  Last referral was 6 years ago & new scan will be needed for them to review & get pt scheduled

## 2019-07-14 NOTE — Telephone Encounter (Signed)
Left message to return call 

## 2019-07-19 MED FILL — ADDERALL XR 30 MG CAP SA: 30 | 90 days supply | Qty: 90 | Fill #0

## 2019-07-19 MED FILL — AMPHETAMINE-DEXTROAMPHETAMI: 10 | 90 days supply | Qty: 180 | Fill #0

## 2019-07-26 NOTE — Telephone Encounter (Signed)
Sent mychart message

## 2019-07-28 NOTE — Telephone Encounter (Signed)
Left message to return call. MRI ordered

## 2019-08-03 NOTE — Telephone Encounter (Signed)
Left message to return call. Mri is scheduled for 10/5 aph at 2pm.

## 2019-08-05 NOTE — Telephone Encounter (Signed)
Tried to call again since appt is Monday and had to leave a message to return call.

## 2019-08-08 ENCOUNTER — Other Ambulatory Visit: Payer: Self-pay

## 2019-08-08 ENCOUNTER — Ambulatory Visit (HOSPITAL_COMMUNITY)
Admission: RE | Admit: 2019-08-08 | Discharge: 2019-08-08 | Disposition: A | Discharge status: Home / Self Care | Payer: No Typology Code available for payment source | Source: Ambulatory Visit | Attending: Family Medicine | Admitting: Family Medicine

## 2019-08-08 DIAGNOSIS — G8929 Other chronic pain: Secondary | ICD-10-CM | POA: Insufficient documentation

## 2019-08-08 DIAGNOSIS — M545 Low back pain: Secondary | ICD-10-CM | POA: Insufficient documentation

## 2019-08-08 NOTE — Telephone Encounter (Signed)
Left message to return call 

## 2019-08-09 NOTE — Telephone Encounter (Signed)
Patient completed MRI as scheduled- results in EPIC

## 2019-08-10 MED FILL — ALPRAZolam 0.5 MG TABS: 0.5 | 45 days supply | Qty: 45 | Fill #0

## 2019-08-11 MED FILL — HYDROCODON-APAP 5-325: 5-325 | 30 days supply | Qty: 90 | Fill #0

## 2019-09-09 ENCOUNTER — Encounter: Payer: Self-pay | Admitting: *Deleted

## 2019-09-12 ENCOUNTER — Other Ambulatory Visit: Payer: Self-pay | Admitting: *Deleted

## 2019-09-12 DIAGNOSIS — M549 Dorsalgia, unspecified: Secondary | ICD-10-CM

## 2019-09-14 ENCOUNTER — Encounter: Payer: Self-pay | Admitting: Family Medicine

## 2019-09-29 MED FILL — ALPRAZolam 0.5 MG TABS: 0.5 | 45 days supply | Qty: 45 | Fill #1

## 2019-10-06 ENCOUNTER — Other Ambulatory Visit: Payer: Self-pay

## 2019-10-06 ENCOUNTER — Ambulatory Visit (INDEPENDENT_AMBULATORY_CARE_PROVIDER_SITE_OTHER): Payer: No Typology Code available for payment source | Admitting: Family Medicine

## 2019-10-06 ENCOUNTER — Encounter: Payer: Self-pay | Admitting: Family Medicine

## 2019-10-06 DIAGNOSIS — F900 Attention-deficit hyperactivity disorder, predominantly inattentive type: Secondary | ICD-10-CM | POA: Diagnosis not present

## 2019-10-06 DIAGNOSIS — F329 Major depressive disorder, single episode, unspecified: Secondary | ICD-10-CM | POA: Diagnosis not present

## 2019-10-06 DIAGNOSIS — F32A Depression, unspecified: Secondary | ICD-10-CM

## 2019-10-06 DIAGNOSIS — M549 Dorsalgia, unspecified: Secondary | ICD-10-CM | POA: Diagnosis not present

## 2019-10-06 DIAGNOSIS — F419 Anxiety disorder, unspecified: Secondary | ICD-10-CM

## 2019-10-06 MED ORDER — HYDROCODONE-ACETAMINOPHEN 5-325 MG PO TABS: ORAL_TABLET | ORAL | 0 refills | Status: DC

## 2019-10-06 MED ORDER — AMPHETAMINE-DEXTROAMPHETAMINE 10 MG PO TABS: ORAL_TABLET | ORAL | 0 refills | Status: DC

## 2019-10-06 MED ORDER — AMPHETAMINE-DEXTROAMPHET ER 30 MG PO CP24: 30.0000 mg | ORAL_CAPSULE | ORAL | 0 refills | Status: DC

## 2019-10-06 NOTE — Progress Notes (Signed)
   Subjective:    Patient ID: Monica Mahoney, female    DOB: 05-Feb-1981, 38 y.o.   MRN: [id number]  HPI  Patient calls for a follow up on ADHD and pain medication. Patient states she is doing well with both medications with no problems. Patient states she just saw the neurosurgeon who added gabapentin 300mg  daily.  Virtual Visit via Video Note  I connected with Monica Mahoney on 10/06/19 at  8:30 AM EST by a video enabled telemedicine application and verified that I am speaking with the correct person using two identifiers.  Location: Patient: home Provider: office   I discussed the limitations of evaluation and management by telemedicine and the availability of in person appointments. The patient expressed understanding and agreed to proceed.  History of Present Illness:    Observations/Objective:   Assessment and Plan:   Follow Up Instructions:    I discussed the assessment and treatment plan with the patient. The patient was provided an opportunity to ask questions and all were answered. The patient agreed with the plan and demonstrated an understanding of the instructions.   The patient was advised to call back or seek an in-person evaluation if the symptoms worsen or if the condition fails to improve as anticipated.  I provided 18 minutes of non-face-to-face time during this encounter.  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  Reports overall attention and focusing on doing reasonably well  Patient compliant with pain medication. Continues to experience the pain which led to initiation of analgesic intervention. No significant negative side effects. States definitely needs the pain medication to maintain current level of functioning. Does not receive controlled substance pain medication elsewhere.    Review of Systems No headache, no major weight loss or weight gain, no chest pain no back pain abdominal  pain no change in bowel habits complete ROS otherwise negative     Objective:   Physical Exam   Virtual     Assessment & Plan:  Impression Chronic pain.  Impression: Chronic pain. Patient compliant with medication. No substantial side effects. [place] controlled substance registry reviewed to ensure compliance and proper use of medication. Patient aware goal of medicine is not complete resolution of pain but to control his symptoms to improve his functional capacity. Aware of potential adverse side effects  #2 ADHD.  Overall good control.  On medications meds refilled  3.  Mood disorder clinically stable on meds to maintain same  Follow-up in 3 months diet exercise discussed management

## 2019-10-07 MED FILL — BUPROPION HCL SR 150 MG TAB: 150 | 90 days supply | Qty: 180 | Fill #0

## 2019-10-07 MED FILL — HYDROCODON-APAP 5-325: 5-325 | 30 days supply | Qty: 90 | Fill #0

## 2019-10-17 MED FILL — ADDERALL XR 30 MG CAP SA: 30 | 90 days supply | Qty: 90 | Fill #0

## 2019-10-17 MED FILL — AMPHETAMINE-DEXTROAMPHETAMI: 10 | 90 days supply | Qty: 180 | Fill #0

## 2019-10-25 MED FILL — GABAPENTIN 300 MG CAPSULE: 300 | 30 days supply | Qty: 30 | Fill #0

## 2019-11-07 MED FILL — HYDROCODON-APAP 5-325: 5-325 | 30 days supply | Qty: 90 | Fill #0

## 2019-11-25 MED FILL — ALPRAZolam 0.5 MG TABS: 0.5 | 45 days supply | Qty: 45 | Fill #2

## 2019-11-25 MED FILL — GABAPENTIN 300 MG CAPSULE: 300 | 30 days supply | Qty: 30 | Fill #1

## 2019-12-05 ENCOUNTER — Encounter: Payer: Self-pay | Admitting: Family Medicine

## 2019-12-05 MED FILL — HYDROCODON-APAP 5-325: 5-325 | 30 days supply | Qty: 90 | Fill #0

## 2019-12-26 MED FILL — GABAPENTIN 300 MG CAPSULE: 300 | 30 days supply | Qty: 30 | Fill #2

## 2020-01-02 MED FILL — ALPRAZolam 0.5 MG TABS: 0.5 | 45 days supply | Qty: 45 | Fill #3

## 2020-01-05 ENCOUNTER — Other Ambulatory Visit: Payer: Self-pay

## 2020-01-05 ENCOUNTER — Ambulatory Visit (INDEPENDENT_AMBULATORY_CARE_PROVIDER_SITE_OTHER): Payer: No Typology Code available for payment source | Admitting: Family Medicine

## 2020-01-05 DIAGNOSIS — F329 Major depressive disorder, single episode, unspecified: Secondary | ICD-10-CM | POA: Diagnosis not present

## 2020-01-05 DIAGNOSIS — F32A Depression, unspecified: Secondary | ICD-10-CM

## 2020-01-05 DIAGNOSIS — F900 Attention-deficit hyperactivity disorder, predominantly inattentive type: Secondary | ICD-10-CM

## 2020-01-05 DIAGNOSIS — M549 Dorsalgia, unspecified: Secondary | ICD-10-CM | POA: Diagnosis not present

## 2020-01-05 DIAGNOSIS — F419 Anxiety disorder, unspecified: Secondary | ICD-10-CM

## 2020-01-05 MED ORDER — HYDROCODONE-ACETAMINOPHEN 5-325 MG PO TABS: ORAL_TABLET | ORAL | 0 refills | Status: DC

## 2020-01-05 MED ORDER — ALPRAZOLAM 0.5 MG PO TABS: ORAL_TABLET | ORAL | 5 refills | Status: DC

## 2020-01-05 MED ORDER — AMPHETAMINE-DEXTROAMPHETAMINE 10 MG PO TABS: ORAL_TABLET | ORAL | 0 refills | Status: DC

## 2020-01-05 MED ORDER — AMPHETAMINE-DEXTROAMPHET ER 30 MG PO CP24: 30.0000 mg | ORAL_CAPSULE | ORAL | 0 refills | Status: DC

## 2020-01-05 MED FILL — HYDROCODON-APAP 5-325: 5-325 | 30 days supply | Qty: 90 | Fill #0

## 2020-01-05 NOTE — Progress Notes (Signed)
   Subjective:  Audiovideo  Patient ID: Monica Mahoney, female    DOB: 16-Apr-1981, 39 y.o.   MRN: [id number]  HPI Patient was seen today for ADD checkup.  This patient does have ADD.  Patient takes medications for this.  If this does help control overall symptoms.  Please see below. -weight, vital signs reviewed.  The following items were covered. -Compliance with medication : Adderall 30 mg once every morning; Adderall 10 mg one to two in the afternoon  -Problems with completing homework, paying attention/taking good notes in school: n/a  -grades: n/a  - Eating patterns : eats well  -sleeping: no issues  -Additional issues or questions: none   This patient was seen today for chronic pain  The medication list was reviewed and updated.   -Compliance with medication: hydrocodone 5-325 mg  - Number patient states they take daily: 0-3 depending on the day  -when was the last dose patient took? Monday  The patient was advised the importance of maintaining medication and not using illegal substances with these.  Here for refills and follow up  The patient was educated that we can provide 3 monthly scripts for their medication, it is their responsibility to follow the instructions.  Side effects or complications from medications: none  Patient is aware that pain medications are meant to minimize the severity of the pain to allow their pain levels to improve to allow for better function. They are aware of that pain medications cannot totally remove their pain.  Due for UDT ( at least once per year) :   Virtual Visit via Video Note  I connected with Farley Ly on 01/05/20 at  9:00 AM EST by a video enabled telemedicine application and verified that I am speaking with the correct person using two identifiers.  Location: Patient: home Provider: office   I discussed the limitations of evaluation and management by telemedicine and the availability of in person appointments.  The patient expressed understanding and agreed to proceed.  History of Present Illness:    Observations/Objective:   Assessment and Plan:   Follow Up Instructions:    I discussed the assessment and treatment plan with the patient. The patient was provided an opportunity to ask questions and all were answered. The patient agreed with the plan and demonstrated an understanding of the instructions.   The patient was advised to call back or seek an in-person evaluation if the symptoms worsen or if the condition fails to improve as anticipated.  I provided 22 minutes of non-face-to-face time during this encounter.           Review of Systems No headache no chest pain no shortness of breath    Objective:   Physical Exam   Virtual     Assessment & Plan:  Impression chronic pain.  Ongoing low back with left-sided sciatica element.  Pain from original traumatic fracture of the lumbar spine.  With ongoing need for medication.  2.  ADHD.  Substantial.  Medications definitely help.  No negative side effects per patient  3.  Chronic anxiety with element of depression.  Discussed.  Definitely states needs meds long-term.  Will maintain  Exercise encouraged.  Medications refilled.  Follow-up in 3 months.  With the new clinician Dr. Ladona Ridgel.

## 2020-01-07 MED FILL — GABAPENTIN 300 MG CAPSULE: 300 | 30 days supply | Qty: 60 | Fill #0

## 2020-01-09 IMAGING — MR MR LUMBAR SPINE W/O CM
4 of 5 series · 13 of 48 positions shown · non-contrast
Comparison: 08/23/2013

CLINICAL DATA: Back pain, lobe back pain radiating to the left foot
for 20 years

EXAM:
MRI LUMBAR SPINE WITHOUT CONTRAST
TECHNIQUE: Multiplanar, multisequence MR imaging of the lumbar spine was
performed. No intravenous contrast was administered.

[Series 3: T2 · sagittal · 4.0mm · 0.41mm/px · 4 of 15 slices shown (1 of 2)]
[im 1/15]
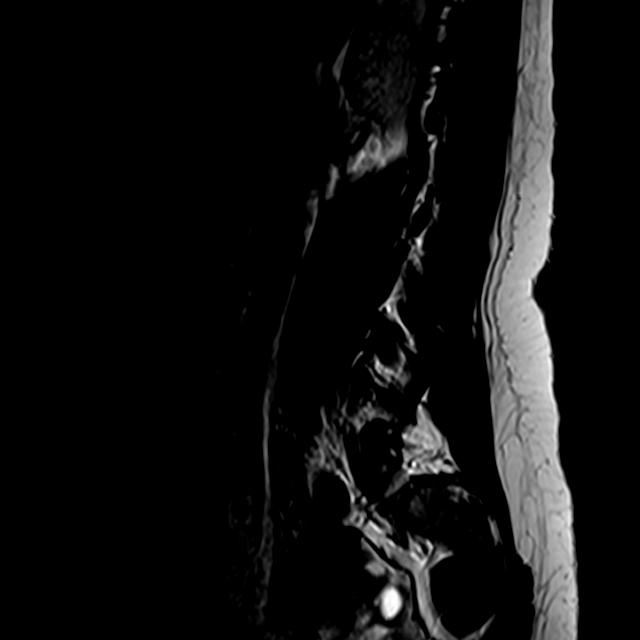
[im 4/15]
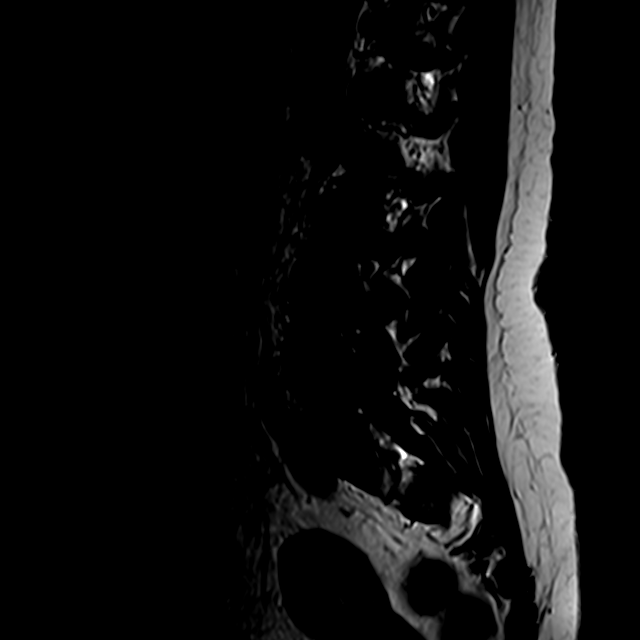
[im 8/15]
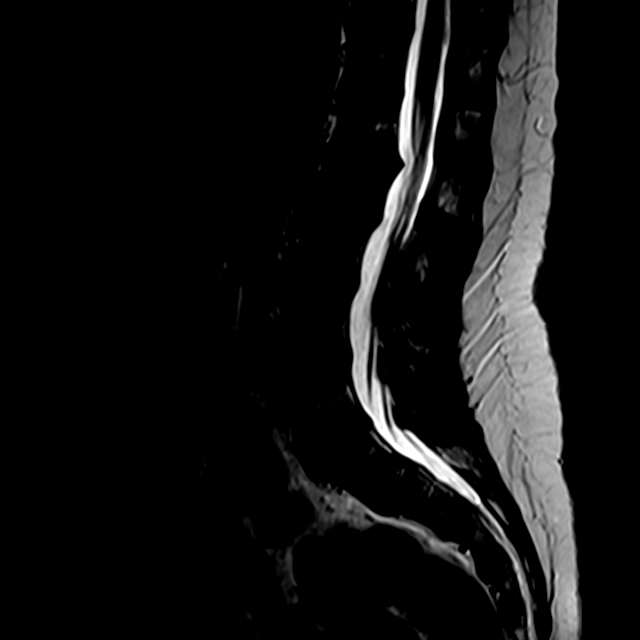
[im 15/15]
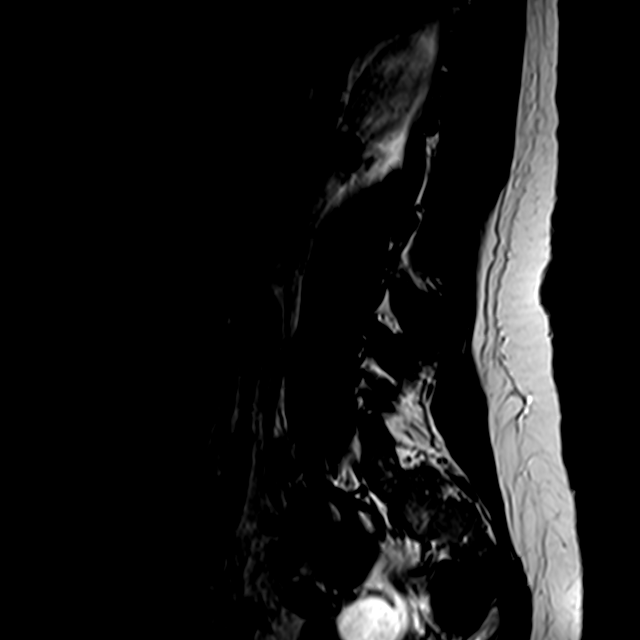

[Series 4: T1 · sagittal · 4.0mm · 0.41mm/px · 3 of 15 slices shown (1 of 2)]
[im 3/15]
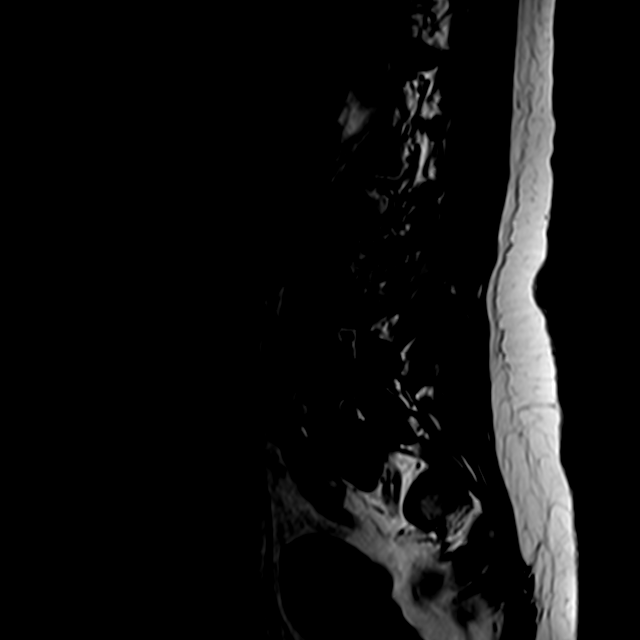
[im 9/15]
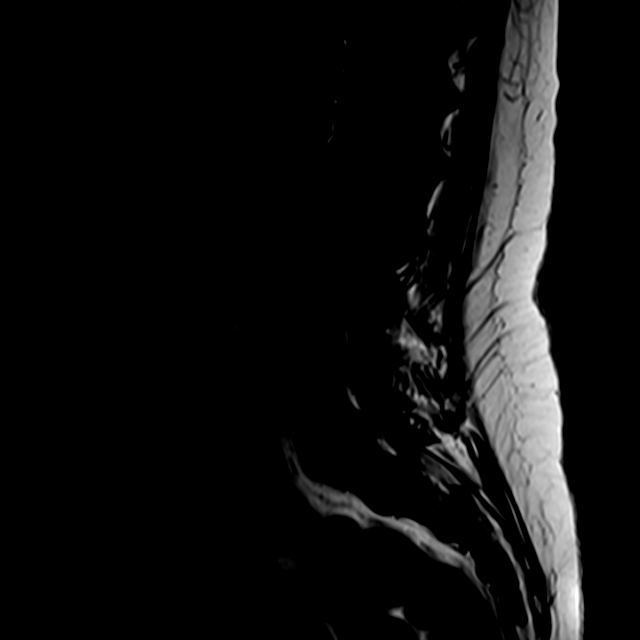
[im 15/15]
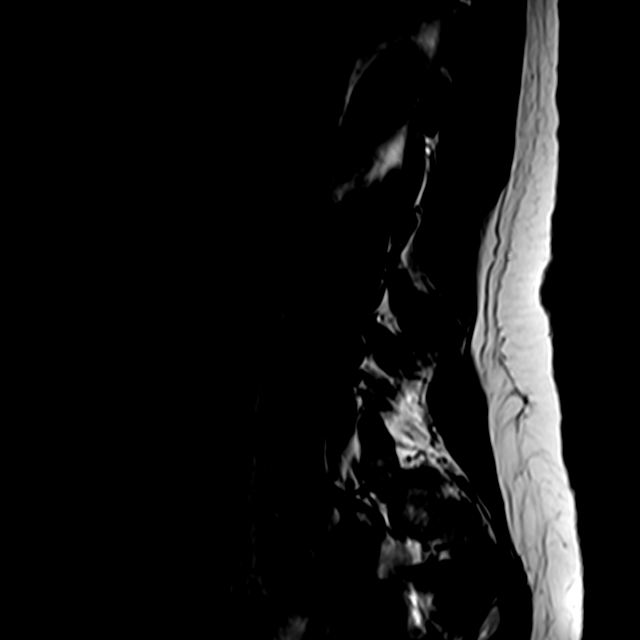

[Series 6: T2 · axial · 4.0mm · 0.25mm/px · z∈[-76,+66]mm · 3 of 40 slices shown (2 of 2)]
[im 6/40]
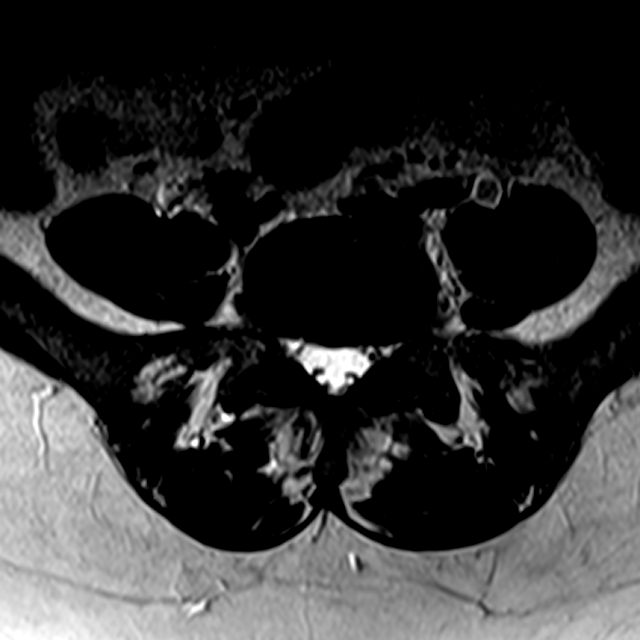
[im 20/40]
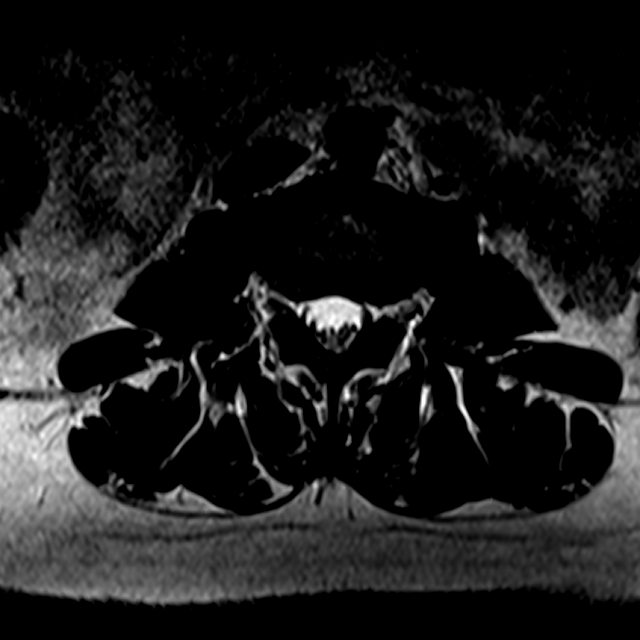
[im 34/40]
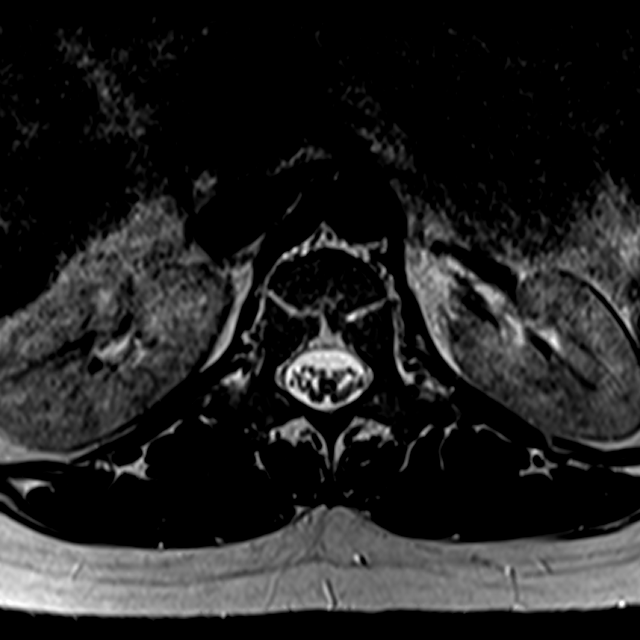

[Series 7: T1 · axial · 4.0mm · 0.25mm/px · z∈[-76,+66]mm · 3 of 40 slices shown (2 of 2)]
[im 6/40]
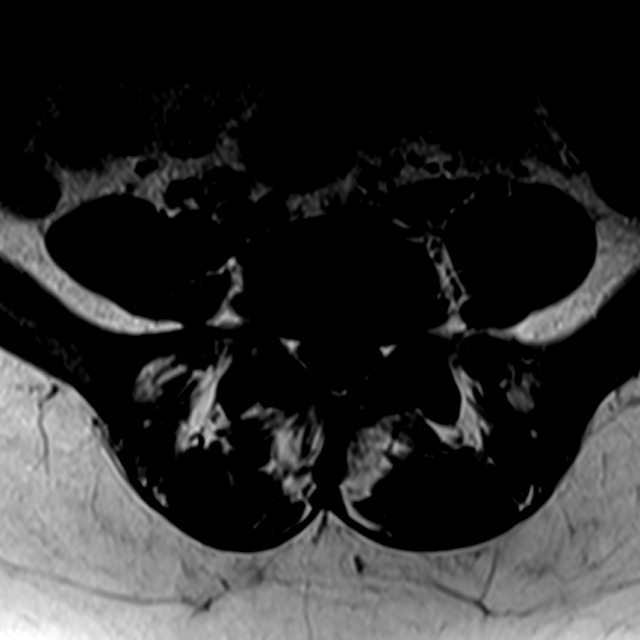
[im 20/40]
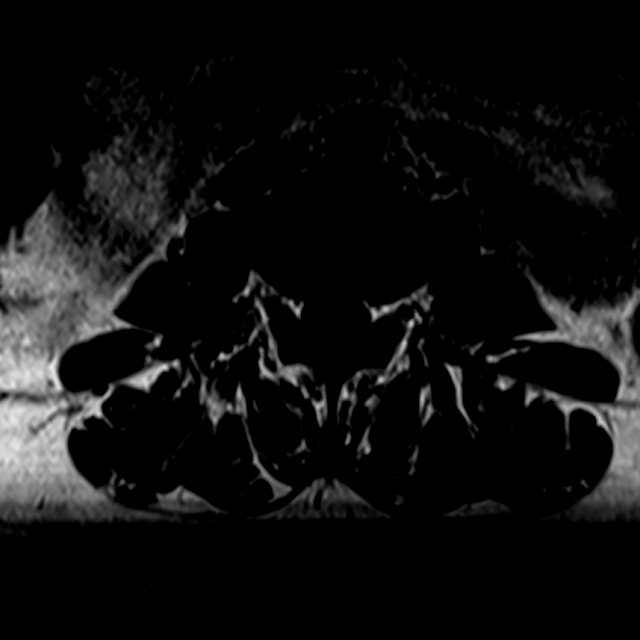
[im 34/40]
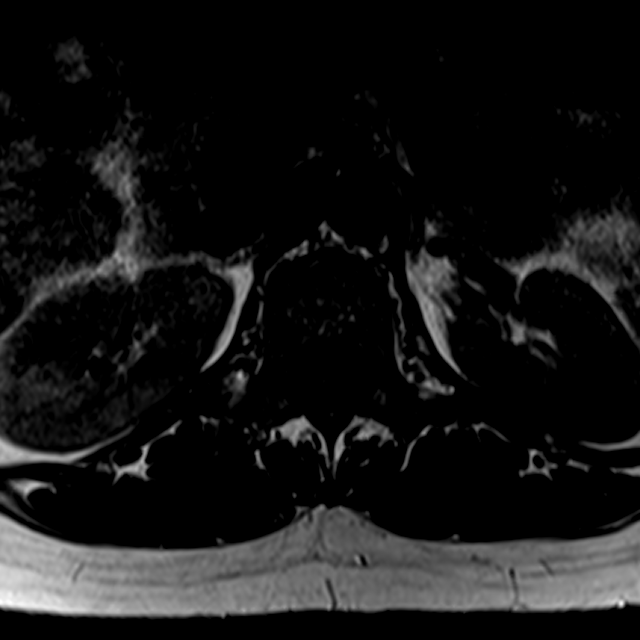

[13 of 48 positions shown; findings below may reference images not displayed]

FINDINGS: Segmentation:  Standard.

Alignment:  Physiologic.

Vertebrae: Chronic L2 vertebral body compression fracture with
approximately 50% height loss anteriorly. Remainder of the vertebral
body heights are maintained. No acute fracture or subluxation. No
discitis or osteomyelitis. No aggressive osseous lesion.

Conus medullaris and cauda equina: Conus extends to the L1 level.
Conus and cauda equina appear normal.

Paraspinal and other soft tissues: No acute paraspinal abnormality.

Disc levels:

Disc spaces: Degenerative disease with mild disc height loss at
L1-2.

T12-L1: No significant disc bulge. No evidence of neural foraminal
stenosis. No central canal stenosis.

L1-L2: Mild broad-based disc bulge. No evidence of neural foraminal
stenosis. No central canal stenosis.

L2-L3: Minimal broad-based disc bulge. No evidence of neural
foraminal stenosis. No central canal stenosis.

L3-L4: No significant disc bulge. No evidence of neural foraminal
stenosis. No central canal stenosis.

L4-L5: No significant disc bulge. No evidence of neural foraminal
stenosis. No central canal stenosis.

L5-S1: Mild broad-based disc bulge. Mild bilateral facet
arthropathy. No evidence of neural foraminal stenosis. No central
canal stenosis.
IMPRESSION: 1. Mild broad-based disc bulges at L1-2, L2-3 and L5-S1. Mild
bilateral facet arthropathy at L5-S1.

## 2020-01-11 MED FILL — AMPHETAMINE-DEXTROAMPHETAMI: 10 | 90 days supply | Qty: 180 | Fill #0

## 2020-01-11 MED FILL — ADDERALL XR 30 MG CAP SA: 30 | 90 days supply | Qty: 90 | Fill #0

## 2020-01-24 ENCOUNTER — Other Ambulatory Visit: Payer: Self-pay | Admitting: Family Medicine

## 2020-01-24 MED FILL — BUPROPION HCL SR 150 MG TAB: 150 | 90 days supply | Qty: 180 | Fill #0

## 2020-02-04 MED FILL — HYDROCODON-APAP 5-325: 5-325 | 30 days supply | Qty: 90 | Fill #0

## 2020-02-05 MED FILL — GABAPENTIN 300 MG CAPSULE: 300 | 30 days supply | Qty: 60 | Fill #1

## 2020-02-07 MED FILL — ALPRAZolam 0.5 MG TABS: 0.5 | 23 days supply | Qty: 45 | Fill #0

## 2020-03-03 MED FILL — ALPRAZolam 0.5 MG TABS: 0.5 | 23 days supply | Qty: 45 | Fill #1

## 2020-03-05 MED FILL — HYDROCODON-APAP 5-325: 5-325 | 30 days supply | Qty: 90 | Fill #0

## 2020-03-05 MED FILL — GABAPENTIN 300 MG CAPSULE: 300 | 30 days supply | Qty: 60 | Fill #0

## 2020-04-03 ENCOUNTER — Ambulatory Visit (INDEPENDENT_AMBULATORY_CARE_PROVIDER_SITE_OTHER): Payer: No Typology Code available for payment source | Admitting: Family Medicine

## 2020-04-03 ENCOUNTER — Telehealth: Payer: Self-pay | Admitting: Family Medicine

## 2020-04-03 ENCOUNTER — Encounter: Payer: Self-pay | Admitting: Family Medicine

## 2020-04-03 ENCOUNTER — Other Ambulatory Visit: Payer: Self-pay

## 2020-04-03 VITALS — BP 122/76 | HR 97 | Temp 97.2°F | Ht 61.0 in | Wt 142.0 lb

## 2020-04-03 DIAGNOSIS — F909 Attention-deficit hyperactivity disorder, unspecified type: Secondary | ICD-10-CM | POA: Diagnosis not present

## 2020-04-03 DIAGNOSIS — F419 Anxiety disorder, unspecified: Secondary | ICD-10-CM

## 2020-04-03 DIAGNOSIS — M5442 Lumbago with sciatica, left side: Secondary | ICD-10-CM

## 2020-04-03 DIAGNOSIS — Z79891 Long term (current) use of opiate analgesic: Secondary | ICD-10-CM

## 2020-04-03 DIAGNOSIS — G8929 Other chronic pain: Secondary | ICD-10-CM

## 2020-04-03 MED ORDER — ALPRAZOLAM 0.5 MG PO TABS: ORAL_TABLET | ORAL | 0 refills | Status: DC

## 2020-04-03 MED ORDER — AMPHETAMINE-DEXTROAMPHET ER 30 MG PO CP24: 30.0000 mg | ORAL_CAPSULE | ORAL | 0 refills | Status: DC

## 2020-04-03 MED ORDER — HYDROCODONE-ACETAMINOPHEN 5-325 MG PO TABS: ORAL_TABLET | ORAL | 0 refills | Status: DC

## 2020-04-03 MED ORDER — AMPHETAMINE-DEXTROAMPHETAMINE 10 MG PO TABS: ORAL_TABLET | ORAL | 0 refills | Status: DC
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
## 2020-04-03 NOTE — Telephone Encounter (Signed)
Pt has appt with neuro surgery on June 17th and is hoping Dr. Ladona Ridgel will give her a partial refill until that appt is made.   [facility] - [place], [place] - [address]

## 2020-04-03 NOTE — Telephone Encounter (Signed)
See below--requesting refill on hydrocodone.

## 2020-04-03 NOTE — Telephone Encounter (Signed)
Already sent small course till her appt with neurosurgery.   Thx.   Dr. Ladona Ridgel

## 2020-04-06 LAB — TOXASSURE SELECT 13 (MW), URINE

## 2020-04-06 MED FILL — HYDROCODON-APAP 5-325: 5-325 | 15 days supply | Qty: 30 | Fill #0

## 2020-04-06 MED FILL — AMPHETAMINE-DEXTROAMPHETAMI: 10 | 30 days supply | Qty: 30 | Fill #0

## 2020-04-06 MED FILL — GABAPENTIN 300 MG CAPSULE: 300 | 30 days supply | Qty: 60 | Fill #0

## 2020-04-06 MED FILL — ADDERALL XR 30 MG CAP SA: 30 | 30 days supply | Qty: 30 | Fill #0

## 2020-04-06 MED FILL — ALPRAZolam 0.5 MG TABS: 0.5 | 23 days supply | Qty: 45 | Fill #2

## 2020-04-24 ENCOUNTER — Other Ambulatory Visit: Payer: Self-pay | Admitting: Family Medicine

## 2020-04-24 MED FILL — BUPROPION HCL SR 150 MG TAB: 150 | 90 days supply | Qty: 180 | Fill #0

## 2020-04-26 DIAGNOSIS — Z79891 Long term (current) use of opiate analgesic: Secondary | ICD-10-CM

## 2020-04-26 DIAGNOSIS — F909 Attention-deficit hyperactivity disorder, unspecified type: Secondary | ICD-10-CM

## 2020-04-26 DIAGNOSIS — G8929 Other chronic pain: Secondary | ICD-10-CM

## 2020-04-26 DIAGNOSIS — F419 Anxiety disorder, unspecified: Secondary | ICD-10-CM

## 2020-04-26 NOTE — Telephone Encounter (Signed)
Laroy Apple M, DO    Please look into the referral to behavioral medicine for this pt, since she states she's not had a return call about getting in for her ADHD.   Thx,  Dr. Ladona Ridgel

## 2020-04-27 NOTE — Telephone Encounter (Signed)
Pt needs to come in to discuss.  But likely trazodone for sleep and buspar for anxiety.   Thx,   Dr. Ladona Ridgel

## 2020-04-30 MED ORDER — AMPHETAMINE-DEXTROAMPHET ER 30 MG PO CP24: 30.0000 mg | ORAL_CAPSULE | ORAL | 0 refills | Status: DC

## 2020-04-30 MED ORDER — HYDROCODONE-ACETAMINOPHEN 5-325 MG PO TABS: ORAL_TABLET | ORAL | 0 refills | Status: DC

## 2020-04-30 MED ORDER — ALPRAZOLAM 0.5 MG PO TABS: ORAL_TABLET | ORAL | 0 refills | Status: DC

## 2020-04-30 MED ORDER — AMPHETAMINE-DEXTROAMPHETAMINE 10 MG PO TABS: ORAL_TABLET | ORAL | 0 refills | Status: DC

## 2020-04-30 MED FILL — ALPRAZolam 0.5 MG TABS: 0.5 | 23 days supply | Qty: 45 | Fill #3

## 2020-05-01 ENCOUNTER — Other Ambulatory Visit: Payer: Self-pay | Admitting: *Deleted

## 2020-05-01 DIAGNOSIS — F419 Anxiety disorder, unspecified: Secondary | ICD-10-CM

## 2020-05-01 DIAGNOSIS — G8929 Other chronic pain: Secondary | ICD-10-CM

## 2020-05-01 DIAGNOSIS — Z79891 Long term (current) use of opiate analgesic: Secondary | ICD-10-CM

## 2020-05-01 DIAGNOSIS — F909 Attention-deficit hyperactivity disorder, unspecified type: Secondary | ICD-10-CM

## 2020-05-01 MED ORDER — AMPHETAMINE-DEXTROAMPHETAMINE 10 MG PO TABS: ORAL_TABLET | ORAL | 0 refills | Status: DC

## 2020-05-01 MED ORDER — HYDROCODONE-ACETAMINOPHEN 5-325 MG PO TABS: ORAL_TABLET | ORAL | 0 refills | Status: DC

## 2020-05-01 MED ORDER — ALPRAZOLAM 0.5 MG PO TABS: ORAL_TABLET | ORAL | 0 refills | Status: DC

## 2020-05-01 MED ORDER — AMPHETAMINE-DEXTROAMPHET ER 30 MG PO CP24: 30.0000 mg | ORAL_CAPSULE | ORAL | 0 refills | Status: DC

## 2020-05-01 MED FILL — GABAPENTIN 300 MG CAPSULE: 300 | 30 days supply | Qty: 60 | Fill #1

## 2020-05-05 MED FILL — HYDROCODON-APAP 5-325: 5-325 | 15 days supply | Qty: 30 | Fill #0

## 2020-05-05 MED FILL — AMPHETAMINE SALTS 10 MG: 10 | 30 days supply | Qty: 30 | Fill #0

## 2020-05-05 MED FILL — ADDERALL XR 30 MG CAP SA: 30 | 30 days supply | Qty: 30 | Fill #0

## 2020-05-08 ENCOUNTER — Encounter: Payer: Self-pay | Admitting: Family Medicine

## 2020-05-28 MED FILL — GABAPENTIN 300 MG CAPSULE: 300 | 30 days supply | Qty: 60 | Fill #2

## 2020-05-28 MED FILL — ALPRAZolam 0.5 MG TABS: 0.5 | 23 days supply | Qty: 45 | Fill #4

## 2020-05-30 ENCOUNTER — Ambulatory Visit: Payer: No Typology Code available for payment source | Admitting: Adult Health

## 2020-05-30 ENCOUNTER — Telehealth: Payer: Self-pay | Admitting: Family Medicine

## 2020-05-30 NOTE — Telephone Encounter (Signed)
Pt is looking for new PCP.  Her old PCP has retired that she seen for 10+ years.  New provider has taken his place and wants the pt to see multiple specialists for the hydrocodone, adderall and Xanax.  She is looking for a PCP to prescribe all 3.  She stats she has been on these meds for 10+ years and is stable.  Will submit for testing.  Kandee Keen is not comfortable seeing this patient.  Will Dr. Ardyth Harps take her?

## 2020-05-30 NOTE — Telephone Encounter (Signed)
Left a message for a return call.

## 2020-05-30 NOTE — Telephone Encounter (Signed)
I cannot guarantee controlled substances will be prescribed, until I see her and assess her issues. I would probably still recommend psych follow up as has been requested by current PCP.
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
## 2020-05-30 NOTE — Telephone Encounter (Signed)
Sorry but I cannot see her

## 2020-05-30 NOTE — Telephone Encounter (Signed)
Will check with Dr. Clent Ridges to see if he is comfortable seeing the pt since she has been stable on meds for so long.

## 2020-05-31 NOTE — Telephone Encounter (Signed)
Spoke with patient and an appointment scheduled 

## 2020-06-12 ENCOUNTER — Encounter: Payer: Self-pay | Admitting: Internal Medicine

## 2020-06-12 ENCOUNTER — Telehealth (INDEPENDENT_AMBULATORY_CARE_PROVIDER_SITE_OTHER): Payer: No Typology Code available for payment source | Admitting: Internal Medicine

## 2020-06-12 ENCOUNTER — Other Ambulatory Visit: Payer: Self-pay | Admitting: Internal Medicine

## 2020-06-12 DIAGNOSIS — F900 Attention-deficit hyperactivity disorder, predominantly inattentive type: Secondary | ICD-10-CM

## 2020-06-12 DIAGNOSIS — G8929 Other chronic pain: Secondary | ICD-10-CM

## 2020-06-12 DIAGNOSIS — G4700 Insomnia, unspecified: Secondary | ICD-10-CM

## 2020-06-12 DIAGNOSIS — F330 Major depressive disorder, recurrent, mild: Secondary | ICD-10-CM | POA: Diagnosis not present

## 2020-06-12 DIAGNOSIS — F32A Depression, unspecified: Secondary | ICD-10-CM | POA: Insufficient documentation

## 2020-06-12 DIAGNOSIS — F909 Attention-deficit hyperactivity disorder, unspecified type: Secondary | ICD-10-CM

## 2020-06-12 DIAGNOSIS — M5440 Lumbago with sciatica, unspecified side: Secondary | ICD-10-CM

## 2020-06-12 MED ORDER — AMPHETAMINE-DEXTROAMPHET ER 30 MG PO CP24: 30.0000 mg | ORAL_CAPSULE | ORAL | 0 refills | Status: DC

## 2020-06-12 MED ORDER — ALPRAZOLAM 0.5 MG PO TABS: 0.5000 mg | ORAL_TABLET | Freq: Every evening | ORAL | 0 refills | Status: DC | PRN

## 2020-06-12 MED ORDER — AMPHETAMINE-DEXTROAMPHETAMINE 10 MG PO TABS: ORAL_TABLET | ORAL | 0 refills | Status: DC

## 2020-06-12 MED ORDER — HYDROCODONE-ACETAMINOPHEN 5-325 MG PO TABS: ORAL_TABLET | ORAL | 0 refills | Status: DC

## 2020-06-12 MED ORDER — BUPROPION HCL ER (SR) 150 MG PO TB12: ORAL_TABLET | ORAL | 1 refills | Status: DC

## 2020-06-12 MED ORDER — GABAPENTIN 300 MG PO CAPS: ORAL_CAPSULE | ORAL | 1 refills | Status: DC

## 2020-06-12 MED FILL — ADDERALL XR 30 MG CAP SA: 30 | 30 days supply | Qty: 30 | Fill #0

## 2020-06-12 MED FILL — AMPHETAMINE SALTS 10 MG: 10 | 30 days supply | Qty: 60 | Fill #0

## 2020-06-12 MED FILL — HYDROCODON-APAP 5-325: 5-325 | 30 days supply | Qty: 90 | Fill #0
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
## 2020-06-21 MED FILL — ALPRAZolam 0.5 MG TABS: 0.5 | 23 days supply | Qty: 45 | Fill #5

## 2020-06-25 MED FILL — GABAPENTIN 300 MG CAPSULE: 300 | 90 days supply | Qty: 270 | Fill #0

## 2020-06-27 ENCOUNTER — Other Ambulatory Visit (HOSPITAL_COMMUNITY): Payer: Self-pay | Admitting: Neurosurgery

## 2020-07-10 ENCOUNTER — Other Ambulatory Visit: Payer: Self-pay | Admitting: Internal Medicine

## 2020-07-10 DIAGNOSIS — G8929 Other chronic pain: Secondary | ICD-10-CM

## 2020-07-11 MED ORDER — HYDROCODONE-ACETAMINOPHEN 5-325 MG PO TABS: 1.0000 | ORAL_TABLET | Freq: Three times a day (TID) | ORAL | 0 refills | Status: DC

## 2020-07-11 MED FILL — HYDROCODON-APAP 5-325: 5-325 | 30 days supply | Qty: 90 | Fill #0

## 2020-07-13 MED FILL — ADDERALL XR 30 MG CAP SA: 30 | 30 days supply | Qty: 30 | Fill #0

## 2020-07-13 MED FILL — AMPHETAMINE SALTS 10 MG: 10 | 30 days supply | Qty: 60 | Fill #0

## 2020-07-23 MED FILL — BUPROPION HCL SR 150 MG TAB: 150 | 90 days supply | Qty: 180 | Fill #0

## 2020-08-10 MED FILL — HYDROCODON-APAP 5-325: 5-325 | 10 days supply | Qty: 30 | Fill #0

## 2020-08-12 MED FILL — ADDERALL XR 30 MG CAP SA: 30 | 30 days supply | Qty: 30 | Fill #0

## 2020-08-12 MED FILL — AMPHETAMINE SALTS 10 MG: 10 | 30 days supply | Qty: 60 | Fill #0

## 2020-08-14 ENCOUNTER — Ambulatory Visit: Payer: No Typology Code available for payment source | Admitting: Internal Medicine

## 2020-09-04 ENCOUNTER — Other Ambulatory Visit: Payer: Self-pay | Admitting: Internal Medicine

## 2020-09-04 DIAGNOSIS — G4700 Insomnia, unspecified: Secondary | ICD-10-CM

## 2020-09-05 ENCOUNTER — Other Ambulatory Visit: Payer: Self-pay | Admitting: Internal Medicine

## 2020-09-05 DIAGNOSIS — G4700 Insomnia, unspecified: Secondary | ICD-10-CM

## 2020-09-05 MED FILL — ALPRAZolam 0.5 MG TABS: 0.5 | 22 days supply | Qty: 45 | Fill #0

## 2020-09-05 NOTE — Telephone Encounter (Signed)
Last filled 07/11/20.  There should be a refill on file.  Please deny Hydrocodone

## 2020-09-08 MED FILL — HYDROCODON-APAP 5-325: 5-325 | 30 days supply | Qty: 90 | Fill #0

## 2020-09-12 ENCOUNTER — Other Ambulatory Visit: Payer: Self-pay | Admitting: Internal Medicine

## 2020-09-12 ENCOUNTER — Encounter: Payer: Self-pay | Admitting: Internal Medicine

## 2020-09-12 ENCOUNTER — Ambulatory Visit: Payer: No Typology Code available for payment source | Admitting: Internal Medicine

## 2020-09-12 DIAGNOSIS — F909 Attention-deficit hyperactivity disorder, unspecified type: Secondary | ICD-10-CM

## 2020-09-13 ENCOUNTER — Other Ambulatory Visit: Payer: Self-pay | Admitting: Internal Medicine

## 2020-09-13 MED ORDER — AMPHETAMINE-DEXTROAMPHETAMINE 10 MG PO TABS: ORAL_TABLET | ORAL | 0 refills | Status: DC

## 2020-09-13 MED ORDER — ALBUTEROL SULFATE HFA 108 (90 BASE) MCG/ACT IN AERS: 2.0000 | INHALATION_SPRAY | Freq: Four times a day (QID) | RESPIRATORY_TRACT | 0 refills | Status: DC | PRN

## 2020-09-13 MED ORDER — AMPHETAMINE-DEXTROAMPHET ER 30 MG PO CP24: 30.0000 mg | ORAL_CAPSULE | ORAL | 0 refills | Status: DC

## 2020-09-13 MED FILL — ADDERALL XR 30 MG CAP SA: 30 | 30 days supply | Qty: 30 | Fill #0

## 2020-09-13 MED FILL — AMPHETAMINE SALTS 10 MG: 10 | 30 days supply | Qty: 60 | Fill #0

## 2020-09-22 MED FILL — GABAPENTIN 300 MG CAPSULE: 300 | 30 days supply | Qty: 90 | Fill #0

## 2020-10-02 ENCOUNTER — Ambulatory Visit (INDEPENDENT_AMBULATORY_CARE_PROVIDER_SITE_OTHER): Payer: No Typology Code available for payment source | Admitting: Internal Medicine

## 2020-10-02 ENCOUNTER — Other Ambulatory Visit: Payer: Self-pay

## 2020-10-02 ENCOUNTER — Other Ambulatory Visit: Payer: Self-pay | Admitting: Internal Medicine

## 2020-10-02 ENCOUNTER — Encounter: Payer: Self-pay | Admitting: Internal Medicine

## 2020-10-02 VITALS — BP 120/80 | HR 74 | Temp 98.2°F | Wt 148.9 lb

## 2020-10-02 DIAGNOSIS — G8929 Other chronic pain: Secondary | ICD-10-CM

## 2020-10-02 DIAGNOSIS — F330 Major depressive disorder, recurrent, mild: Secondary | ICD-10-CM | POA: Diagnosis not present

## 2020-10-02 DIAGNOSIS — F909 Attention-deficit hyperactivity disorder, unspecified type: Secondary | ICD-10-CM

## 2020-10-02 DIAGNOSIS — M5440 Lumbago with sciatica, unspecified side: Secondary | ICD-10-CM

## 2020-10-02 DIAGNOSIS — G4700 Insomnia, unspecified: Secondary | ICD-10-CM | POA: Diagnosis not present

## 2020-10-02 DIAGNOSIS — J45909 Unspecified asthma, uncomplicated: Secondary | ICD-10-CM

## 2020-10-02 MED ORDER — GABAPENTIN 300 MG PO CAPS: ORAL_CAPSULE | ORAL | 1 refills | Status: DC

## 2020-10-02 MED ORDER — HYDROCODONE-ACETAMINOPHEN 5-325 MG PO TABS: 1.0000 | ORAL_TABLET | Freq: Three times a day (TID) | ORAL | 0 refills | Status: DC

## 2020-10-02 MED ORDER — AMPHETAMINE-DEXTROAMPHET ER 30 MG PO CP24: 30.0000 mg | ORAL_CAPSULE | ORAL | 0 refills | Status: DC

## 2020-10-02 MED ORDER — ALBUTEROL SULFATE HFA 108 (90 BASE) MCG/ACT IN AERS: 2.0000 | INHALATION_SPRAY | Freq: Four times a day (QID) | RESPIRATORY_TRACT | 0 refills | Status: DC | PRN

## 2020-10-02 MED ORDER — BUPROPION HCL ER (SR) 150 MG PO TB12: ORAL_TABLET | ORAL | 1 refills | Status: DC

## 2020-10-02 MED ORDER — AMPHETAMINE-DEXTROAMPHETAMINE 10 MG PO TABS: ORAL_TABLET | ORAL | 0 refills | Status: DC

## 2020-10-02 MED ORDER — HYDROCODONE-ACETAMINOPHEN 5-325 MG PO TABS: ORAL_TABLET | ORAL | 0 refills | Status: DC

## 2020-10-02 MED ORDER — CETIRIZINE HCL 10 MG PO TABS: 10.0000 mg | ORAL_TABLET | Freq: Every day | ORAL | 1 refills | Status: DC

## 2020-10-02 MED ORDER — ALPRAZOLAM 0.5 MG PO TABS: ORAL_TABLET | ORAL | 0 refills | Status: DC

## 2020-10-02 MED FILL — ALPRAZolam 0.5 MG TABS: 0.5 | 23 days supply | Qty: 45 | Fill #0

## 2020-10-02 MED FILL — CETIRIZINE HCL 10 MG TABS: 10 | 90 days supply | Qty: 90 | Fill #0

## 2020-10-02 MED FILL — ALBUTEROL SULFATE HFA 108 (: 108 (90 BAS | 25 days supply | Qty: 9 | Fill #0

## 2020-10-02 NOTE — Patient Instructions (Signed)
-  Nice seeing you today!!  -Schedule follow up in 3 months for your physical. 
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
## 2020-10-03 MED FILL — HYDROCODON-APAP 5-325: 5-325 | 30 days supply | Qty: 90 | Fill #0

## 2020-10-11 MED FILL — ADDERALL XR 30 MG CAP SA: 30 | 30 days supply | Qty: 30 | Fill #0

## 2020-10-11 MED FILL — AMPHETAMINE SALTS 10 MG: 10 | 30 days supply | Qty: 60 | Fill #0

## 2020-10-19 ENCOUNTER — Ambulatory Visit
Admission: EM | Admit: 2020-10-19 | Discharge: 2020-10-19 | Disposition: A | Discharge status: Home / Self Care | Payer: No Typology Code available for payment source | Attending: Emergency Medicine | Admitting: Emergency Medicine

## 2020-10-19 ENCOUNTER — Encounter: Payer: Self-pay | Admitting: Internal Medicine

## 2020-10-19 ENCOUNTER — Other Ambulatory Visit: Payer: Self-pay | Admitting: Emergency Medicine

## 2020-10-19 ENCOUNTER — Other Ambulatory Visit: Payer: Self-pay

## 2020-10-19 DIAGNOSIS — M25511 Pain in right shoulder: Secondary | ICD-10-CM

## 2020-10-19 MED ORDER — CYCLOBENZAPRINE HCL 10 MG PO TABS: 10.0000 mg | ORAL_TABLET | Freq: Every day | ORAL | 0 refills | Status: DC

## 2020-10-19 MED ORDER — PREDNISONE 10 MG PO TABS: 20.0000 mg | ORAL_TABLET | Freq: Every day | ORAL | 0 refills | Status: DC

## 2020-10-19 MED FILL — BUPROPION HCL SR 150 MG TAB: 150 | 90 days supply | Qty: 180 | Fill #1

## 2020-10-19 MED FILL — predniSONE 10 MG TABS: 10 | 7 days supply | Qty: 15 | Fill #0

## 2020-10-19 MED FILL — CYCLOBENZAPRINE HCL 10 MG T: 10 | 20 days supply | Qty: 20 | Fill #0

## 2020-10-19 MED FILL — GABAPENTIN 300 MG CAPSULE: 300 | 30 days supply | Qty: 90 | Fill #1

## 2020-10-19 NOTE — Discharge Instructions (Addendum)
Rest, ice and heat as needed Ensure adequate ROM as tolerated. Continue to take Vicodin as prescribed for severe pain  Take Tylenol as needed for mild to moderate pain prescribed flexeril  for muscle spasm.  Do not drive or operate heavy machinery while taking this medication Prescribed prednisone take as directed Return here or go to ER if you have any new or worsening symptoms such as numbness/tingling of the inner thighs, loss of bladder or bowel control, headache/blurry vision, nausea/vomiting, confusion/altered mental status, dizziness, weakness, passing out, imbalance, etc..Marland Kitchen
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
## 2020-10-19 NOTE — ED Triage Notes (Signed)
Pt presents with right shoulder pain that radiates to  Under shoulder blade after hanging curtain rod

## 2020-11-01 MED FILL — HYDROCODON-APAP 5-325: 5-325 | 30 days supply | Qty: 90 | Fill #0

## 2020-11-09 MED FILL — AMPHETAMINE SALTS 10 MG: 10 | 30 days supply | Qty: 60 | Fill #0

## 2020-11-09 MED FILL — ADDERALL XR 30 MG CAP SA: 30 | 30 days supply | Qty: 30 | Fill #0

## 2020-11-22 ENCOUNTER — Ambulatory Visit: Payer: No Typology Code available for payment source | Attending: Internal Medicine

## 2020-11-22 DIAGNOSIS — Z23 Encounter for immunization: Secondary | ICD-10-CM

## 2020-11-22 MED FILL — GABAPENTIN 300 MG CAPSULE: 300 | 30 days supply | Qty: 90 | Fill #2

## 2020-11-22 NOTE — Progress Notes (Signed)
   Covid-19 Vaccination Clinic  Name:  Monica Mahoney    MRN: [id number] DOB: 1981/10/25  11/22/2020  [name] was observed post Covid-19 immunization for 15 minutes without incident. She was provided with Vaccine Information Sheet and instruction to access the V-Safe system.   [name] was instructed to call 911 with any severe reactions post vaccine: Marland Kitchen Difficulty breathing  . Swelling of face and throat  . A fast heartbeat  . A bad rash all over body  . Dizziness and weakness   Immunizations Administered    Name Date Dose VIS Date Route   Pfizer COVID-19 Vaccine 11/22/2020  1:09 PM 0.3 mL 08/22/2020 Intramuscular   Manufacturer: [employer], [employer]   Lot: [id number]   NDC: [address]-1

## 2020-11-27 ENCOUNTER — Other Ambulatory Visit: Payer: Self-pay | Admitting: Internal Medicine

## 2020-11-27 DIAGNOSIS — G4700 Insomnia, unspecified: Secondary | ICD-10-CM

## 2020-11-27 MED FILL — ALPRAZolam 0.5 MG TABS: 0.5 | 22 days supply | Qty: 45 | Fill #0

## 2020-12-03 MED FILL — HYDROCODON-APAP 5-325: 5-325 | 30 days supply | Qty: 90 | Fill #0

## 2020-12-04 ENCOUNTER — Telehealth: Payer: Self-pay | Admitting: Internal Medicine

## 2020-12-04 NOTE — Telephone Encounter (Signed)
Pt is calling in needing a refill on the following Rx's alprazolam (XANAX) 0.5 MG, amphetamine-dextroamphetamine (ADDERALL XR) 30 MG, Hydrocodone-acetaminophen (NORCO/VICODIN) 5-325 MG, gabapentin (NEURONTIN) 300 MG and bupropion (WELLBUTRIN SR) 150 MG Pharm:  [facility].  Pt last visit was on 10/02/2020 and she has scheduled a CPE 01/22/2021 @ 10:00.

## 2020-12-04 NOTE — Telephone Encounter (Signed)
Xanax last refill 11/27/20 - #45 adderall last refill 11/30 - with 2 refills Gabapentin last refill 11/30 - #270 with 1 refill Wellbutrin last refill 11/30 - 180 with 1 refill Hydrocodone last refill 11/30 - #90

## 2020-12-11 MED FILL — AMPHETAMINE SALTS 10 MG: 10 | 30 days supply | Qty: 60 | Fill #0

## 2020-12-11 MED FILL — ADDERALL XR 30 MG CAP SA: 30 | 30 days supply | Qty: 30 | Fill #0

## 2020-12-25 MED FILL — GABAPENTIN 300 MG CAPSULE: 300 | 90 days supply | Qty: 270 | Fill #1

## 2020-12-26 ENCOUNTER — Encounter: Payer: Self-pay | Admitting: Internal Medicine

## 2020-12-26 NOTE — Telephone Encounter (Signed)
Spoke with patient and an appointment scheduled 

## 2020-12-27 ENCOUNTER — Telehealth (INDEPENDENT_AMBULATORY_CARE_PROVIDER_SITE_OTHER): Payer: No Typology Code available for payment source | Admitting: Internal Medicine

## 2020-12-27 ENCOUNTER — Encounter: Payer: Self-pay | Admitting: Internal Medicine

## 2020-12-27 ENCOUNTER — Other Ambulatory Visit: Payer: Self-pay | Admitting: Internal Medicine

## 2020-12-27 ENCOUNTER — Other Ambulatory Visit: Payer: Self-pay

## 2020-12-27 DIAGNOSIS — F909 Attention-deficit hyperactivity disorder, unspecified type: Secondary | ICD-10-CM | POA: Diagnosis not present

## 2020-12-27 DIAGNOSIS — G4700 Insomnia, unspecified: Secondary | ICD-10-CM

## 2020-12-27 DIAGNOSIS — F330 Major depressive disorder, recurrent, mild: Secondary | ICD-10-CM

## 2020-12-27 DIAGNOSIS — G8929 Other chronic pain: Secondary | ICD-10-CM

## 2020-12-27 DIAGNOSIS — M5440 Lumbago with sciatica, unspecified side: Secondary | ICD-10-CM | POA: Diagnosis not present

## 2020-12-27 MED ORDER — AMPHETAMINE-DEXTROAMPHET ER 30 MG PO CP24: 30.0000 mg | ORAL_CAPSULE | ORAL | 0 refills | Status: DC

## 2020-12-27 MED ORDER — AMPHETAMINE-DEXTROAMPHETAMINE 10 MG PO TABS
ORAL_TABLET | ORAL | 0 refills | Status: DC
Start: 2020-12-27 — End: 2021-03-15

## 2020-12-27 MED ORDER — AMPHETAMINE-DEXTROAMPHETAMINE 10 MG PO TABS
ORAL_TABLET | ORAL | 0 refills | Status: DC
Start: 2020-12-27 — End: 2020-12-27

## 2020-12-27 MED ORDER — AMPHETAMINE-DEXTROAMPHETAMINE 10 MG PO TABS: ORAL_TABLET | ORAL | 0 refills | Status: DC

## 2020-12-27 MED ORDER — HYDROCODONE-ACETAMINOPHEN 5-325 MG PO TABS: 1.0000 | ORAL_TABLET | Freq: Three times a day (TID) | ORAL | 0 refills | Status: DC

## 2020-12-27 MED ORDER — ALPRAZOLAM 0.5 MG PO TABS: 0.5000 mg | ORAL_TABLET | Freq: Every evening | ORAL | 0 refills | Status: DC | PRN

## 2020-12-27 MED ORDER — HYDROCODONE-ACETAMINOPHEN 5-325 MG PO TABS: ORAL_TABLET | ORAL | 0 refills | Status: DC

## 2020-12-27 MED ORDER — BUPROPION HCL ER (SR) 150 MG PO TB12: ORAL_TABLET | ORAL | 1 refills | Status: DC

## 2020-12-27 MED FILL — ALPRAZolam 0.5 MG TABS: 0.5 | 45 days supply | Qty: 45 | Fill #0
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
## 2021-01-01 MED FILL — CETIRIZINE HCL 10 MG TABS: 10 | 90 days supply | Qty: 90 | Fill #1

## 2021-01-02 MED FILL — HYDROCODON-APAP 5-325: 5-325 | 30 days supply | Qty: 90 | Fill #0

## 2021-01-08 MED FILL — AMPHETAMINE SALTS 10 MG: 10 | 30 days supply | Qty: 60 | Fill #0

## 2021-01-08 MED FILL — ADDERALL XR 30 MG CAP SA: 30 | 30 days supply | Qty: 30 | Fill #0

## 2021-01-08 MED FILL — BUPROPION HCL SR 150 MG TAB: 150 | 90 days supply | Qty: 180 | Fill #0

## 2021-01-22 ENCOUNTER — Encounter: Payer: No Typology Code available for payment source | Admitting: Internal Medicine

## 2021-01-25 ENCOUNTER — Other Ambulatory Visit (HOSPITAL_BASED_OUTPATIENT_CLINIC_OR_DEPARTMENT_OTHER): Payer: Self-pay

## 2021-01-30 MED FILL — HYDROCODON-APAP 5-325: 5-325 | 30 days supply | Qty: 90 | Fill #0

## 2021-02-02 ENCOUNTER — Other Ambulatory Visit (HOSPITAL_COMMUNITY): Payer: Self-pay

## 2021-02-06 ENCOUNTER — Other Ambulatory Visit: Payer: Self-pay | Admitting: Internal Medicine

## 2021-02-06 ENCOUNTER — Other Ambulatory Visit (HOSPITAL_COMMUNITY): Payer: Self-pay

## 2021-02-06 DIAGNOSIS — G4700 Insomnia, unspecified: Secondary | ICD-10-CM

## 2021-02-06 DIAGNOSIS — F909 Attention-deficit hyperactivity disorder, unspecified type: Secondary | ICD-10-CM

## 2021-02-06 MED FILL — Amphetamine-Dextroamphetamine Tab 10 MG: ORAL | 30 days supply | Qty: 60 | Fill #0 | Status: AC

## 2021-02-06 MED FILL — Amphetamine-Dextroamphetamine Cap ER 24HR 30 MG: ORAL | 30 days supply | Qty: 30 | Fill #0 | Status: CN

## 2021-02-07 ENCOUNTER — Other Ambulatory Visit (HOSPITAL_COMMUNITY): Payer: Self-pay

## 2021-02-07 MED ORDER — AMPHETAMINE-DEXTROAMPHET ER 30 MG PO CP24
30.0000 mg | ORAL_CAPSULE | Freq: Every morning | ORAL | 0 refills | Status: DC
  Filled 2021-02-07: qty 30, fill #0

## 2021-02-07 MED ORDER — AMPHETAMINE-DEXTROAMPHET ER 30 MG PO CP24
30.0000 mg | ORAL_CAPSULE | ORAL | 0 refills | Status: DC
  Filled 2021-02-07 – 2021-03-13 (×2): qty 30, 30d supply, fill #0

## 2021-02-07 MED ORDER — AMPHETAMINE-DEXTROAMPHET ER 30 MG PO CP24
30.0000 mg | ORAL_CAPSULE | Freq: Every morning | ORAL | 0 refills | Status: DC
  Filled 2021-02-07: qty 30, 30d supply, fill #0
  Filled 2021-02-07: qty 30, fill #0

## 2021-02-07 NOTE — Telephone Encounter (Signed)
Patient had a virtual visit 12/27/20

## 2021-02-08 ENCOUNTER — Other Ambulatory Visit (HOSPITAL_COMMUNITY): Payer: Self-pay

## 2021-02-12 ENCOUNTER — Other Ambulatory Visit (HOSPITAL_COMMUNITY): Payer: Self-pay

## 2021-02-14 ENCOUNTER — Other Ambulatory Visit (HOSPITAL_COMMUNITY): Payer: Self-pay

## 2021-02-14 ENCOUNTER — Other Ambulatory Visit: Payer: Self-pay | Admitting: Internal Medicine

## 2021-02-14 DIAGNOSIS — G4700 Insomnia, unspecified: Secondary | ICD-10-CM

## 2021-02-14 MED ORDER — ALPRAZOLAM 0.5 MG PO TABS
ORAL_TABLET | ORAL | 0 refills | Status: DC
  Filled 2021-02-14: qty 45, 45d supply, fill #0

## 2021-02-27 ENCOUNTER — Other Ambulatory Visit (HOSPITAL_COMMUNITY): Payer: Self-pay

## 2021-02-27 MED FILL — Hydrocodone-Acetaminophen Tab 5-325 MG: ORAL | 30 days supply | Qty: 90 | Fill #0 | Status: AC

## 2021-03-13 ENCOUNTER — Other Ambulatory Visit (HOSPITAL_COMMUNITY): Payer: Self-pay

## 2021-03-13 MED FILL — Amphetamine-Dextroamphetamine Tab 10 MG: ORAL | 4 days supply | Qty: 8 | Fill #0 | Status: CN

## 2021-03-13 MED FILL — Amphetamine-Dextroamphetamine Tab 10 MG: ORAL | 26 days supply | Qty: 52 | Fill #0 | Status: AC

## 2021-03-13 MED FILL — Amphetamine-Dextroamphetamine Tab 10 MG: ORAL | 4 days supply | Qty: 8 | Fill #0 | Status: AC

## 2021-03-13 MED FILL — Amphetamine-Dextroamphetamine Tab 10 MG: ORAL | 1 days supply | Qty: 52 | Fill #0 | Status: CN

## 2021-03-14 ENCOUNTER — Other Ambulatory Visit (HOSPITAL_COMMUNITY): Payer: Self-pay

## 2021-03-14 ENCOUNTER — Other Ambulatory Visit (HOSPITAL_BASED_OUTPATIENT_CLINIC_OR_DEPARTMENT_OTHER): Payer: Self-pay

## 2021-03-15 ENCOUNTER — Other Ambulatory Visit (HOSPITAL_COMMUNITY): Payer: Self-pay

## 2021-03-15 ENCOUNTER — Encounter: Payer: Self-pay | Admitting: Internal Medicine

## 2021-03-15 ENCOUNTER — Ambulatory Visit (INDEPENDENT_AMBULATORY_CARE_PROVIDER_SITE_OTHER): Payer: No Typology Code available for payment source | Admitting: Internal Medicine

## 2021-03-15 ENCOUNTER — Other Ambulatory Visit: Payer: Self-pay

## 2021-03-15 VITALS — BP 120/80 | HR 81 | Temp 98.6°F | Ht 61.0 in | Wt 143.9 lb

## 2021-03-15 DIAGNOSIS — Z23 Encounter for immunization: Secondary | ICD-10-CM

## 2021-03-15 DIAGNOSIS — F330 Major depressive disorder, recurrent, mild: Secondary | ICD-10-CM | POA: Diagnosis not present

## 2021-03-15 DIAGNOSIS — J45909 Unspecified asthma, uncomplicated: Secondary | ICD-10-CM | POA: Diagnosis not present

## 2021-03-15 DIAGNOSIS — M5440 Lumbago with sciatica, unspecified side: Secondary | ICD-10-CM

## 2021-03-15 DIAGNOSIS — G8929 Other chronic pain: Secondary | ICD-10-CM

## 2021-03-15 DIAGNOSIS — F909 Attention-deficit hyperactivity disorder, unspecified type: Secondary | ICD-10-CM

## 2021-03-15 DIAGNOSIS — Z0000 Encounter for general adult medical examination without abnormal findings: Secondary | ICD-10-CM

## 2021-03-15 DIAGNOSIS — G4700 Insomnia, unspecified: Secondary | ICD-10-CM

## 2021-03-15 DIAGNOSIS — R21 Rash and other nonspecific skin eruption: Secondary | ICD-10-CM

## 2021-03-15 LAB — COMPREHENSIVE METABOLIC PANEL
ALT: 10 U/L (ref 0–35)
AST: 12 U/L (ref 0–37)
Albumin: 4.5 g/dL (ref 3.5–5.2)
Alkaline Phosphatase: 45 U/L (ref 39–117)
BUN: 19 mg/dL (ref 6–23)
CO2: 28 mEq/L (ref 19–32)
Calcium: 9.1 mg/dL (ref 8.4–10.5)
Chloride: 105 mEq/L (ref 96–112)
Creatinine, Ser: 0.89 mg/dL (ref 0.40–1.20)
GFR: 81.46 mL/min (ref >60.00)
Glucose, Bld: 87 mg/dL (ref 70–99)
Potassium: 4.3 mEq/L (ref 3.5–5.1)
Sodium: 140 mEq/L (ref 135–145)
Total Bilirubin: 2 mg/dL — ABNORMAL HIGH (ref 0.2–1.2)
Total Protein: 6.6 g/dL (ref 6.0–8.3)

## 2021-03-15 LAB — CBC WITH DIFFERENTIAL/PLATELET
Basophils Absolute: 0.1 10*3/uL (ref 0.0–0.1)
Basophils Relative: 0.8 % (ref 0.0–3.0)
Eosinophils Absolute: 0.1 10*3/uL (ref 0.0–0.7)
Eosinophils Relative: 1.4 % (ref 0.0–5.0)
HCT: 42.1 % (ref 36.0–46.0)
Hemoglobin: 14.4 g/dL (ref 12.0–15.0)
Lymphocytes Relative: 27.5 % (ref 12.0–46.0)
Lymphs Abs: 1.9 10*3/uL (ref 0.7–4.0)
MCHC: 34.3 g/dL (ref 30.0–36.0)
MCV: 95.4 fl (ref 78.0–100.0)
Monocytes Absolute: 0.3 10*3/uL (ref 0.1–1.0)
Monocytes Relative: 4.4 % (ref 3.0–12.0)
Neutro Abs: 4.6 10*3/uL (ref 1.4–7.7)
Neutrophils Relative %: 65.9 % (ref 43.0–77.0)
Platelets: 247 10*3/uL (ref 150.0–400.0)
RBC: 4.41 Mil/uL (ref 3.87–5.11)
RDW: 12 % (ref 11.5–15.5)
WBC: 7 10*3/uL (ref 4.0–10.5)

## 2021-03-15 LAB — VITAMIN B12: Vitamin B-12: >1550 pg/mL — ABNORMAL HIGH (ref 211–911)

## 2021-03-15 LAB — TSH: TSH: 1.27 u[IU]/mL (ref 0.35–4.50)

## 2021-03-15 LAB — LIPID PANEL
Cholesterol: 138 mg/dL (ref 0–200)
HDL: 50.3 mg/dL (ref >39.00)
LDL Cholesterol: 69 mg/dL (ref 0–99)
NonHDL: 87.71
Total CHOL/HDL Ratio: 3
Triglycerides: 94 mg/dL (ref 0.0–149.0)
VLDL: 18.8 mg/dL (ref 0.0–40.0)

## 2021-03-15 LAB — HEMOGLOBIN A1C: Hgb A1c MFr Bld: 4.3 % — ABNORMAL LOW (ref 4.6–6.5)

## 2021-03-15 LAB — VITAMIN D 25 HYDROXY (VIT D DEFICIENCY, FRACTURES): VITD: 81.63 ng/mL (ref 30.00–100.00)

## 2021-03-15 MED ORDER — CETIRIZINE HCL 10 MG PO TABS
ORAL_TABLET | Freq: Every day | ORAL | 1 refills | Status: DC
  Filled 2021-03-15: qty 90, fill #0
  Filled 2021-03-27: qty 90, 90d supply, fill #0

## 2021-03-15 MED ORDER — AMPHETAMINE-DEXTROAMPHETAMINE 10 MG PO TABS
ORAL_TABLET | ORAL | 0 refills | Status: DC
Start: 2021-03-15 — End: 2021-06-14
  Filled 2021-03-15: qty 60, fill #0
  Filled 2021-05-18: qty 60, 30d supply, fill #0

## 2021-03-15 MED ORDER — AMPHETAMINE-DEXTROAMPHETAMINE 10 MG PO TABS
ORAL_TABLET | ORAL | 0 refills | Status: DC
  Filled 2021-03-15 – 2021-04-15 (×2): qty 60, 30d supply, fill #0

## 2021-03-15 MED ORDER — HYDROCODONE-ACETAMINOPHEN 5-325 MG PO TABS
ORAL_TABLET | ORAL | 0 refills | Status: DC
  Filled 2021-03-15 – 2021-03-27 (×2): qty 90, fill #0
  Filled 2021-04-24: qty 90, 30d supply, fill #0

## 2021-03-15 MED ORDER — AMPHETAMINE-DEXTROAMPHET ER 30 MG PO CP24
30.0000 mg | ORAL_CAPSULE | Freq: Every morning | ORAL | 0 refills | Status: DC
  Filled 2021-03-15: qty 30, 30d supply, fill #0

## 2021-03-15 MED ORDER — BUPROPION HCL ER (SR) 150 MG PO TB12
ORAL_TABLET | Freq: Two times a day (BID) | ORAL | 1 refills | Status: DC
  Filled 2021-03-15 – 2021-03-29 (×2): qty 180, 90d supply, fill #0
  Filled 2021-06-18: qty 180, 90d supply, fill #1

## 2021-03-15 MED ORDER — GABAPENTIN 300 MG PO CAPS
ORAL_CAPSULE | ORAL | 1 refills | Status: DC
  Filled 2021-03-15: qty 270, 90d supply, fill #0
  Filled 2021-06-18: qty 270, 90d supply, fill #1

## 2021-03-15 MED ORDER — AMPHETAMINE-DEXTROAMPHET ER 30 MG PO CP24
30.0000 mg | ORAL_CAPSULE | Freq: Every morning | ORAL | 0 refills | Status: DC
  Filled 2021-03-15 – 2021-05-18 (×2): qty 30, 30d supply, fill #0

## 2021-03-15 MED ORDER — ALBUTEROL SULFATE HFA 108 (90 BASE) MCG/ACT IN AERS
2.0000 | INHALATION_SPRAY | Freq: Four times a day (QID) | RESPIRATORY_TRACT | 0 refills | Status: DC | PRN
Start: 2021-03-15 — End: 2021-07-16
  Filled 2021-03-15: qty 8.5, 25d supply, fill #0

## 2021-03-15 MED ORDER — ALPRAZOLAM 0.5 MG PO TABS
ORAL_TABLET | ORAL | 0 refills | Status: DC
  Filled 2021-03-15 – 2021-03-27 (×2): qty 45, 45d supply, fill #0

## 2021-03-15 MED ORDER — HYDROCODONE-ACETAMINOPHEN 5-325 MG PO TABS
ORAL_TABLET | ORAL | 0 refills | Status: DC
  Filled 2021-03-15: qty 90, fill #0
  Filled 2021-05-22: qty 90, 30d supply, fill #0

## 2021-03-15 MED ORDER — AMPHETAMINE-DEXTROAMPHETAMINE 10 MG PO TABS
ORAL_TABLET | ORAL | 0 refills | Status: DC
  Filled 2021-03-15: qty 60, fill #0

## 2021-03-15 MED ORDER — AMPHETAMINE-DEXTROAMPHET ER 30 MG PO CP24
ORAL_CAPSULE | ORAL | 0 refills | Status: DC
  Filled 2021-03-15: qty 30, fill #0
  Filled 2021-04-12: qty 30, 30d supply, fill #0

## 2021-03-15 MED ORDER — HYDROCODONE-ACETAMINOPHEN 5-325 MG PO TABS
ORAL_TABLET | ORAL | 0 refills | Status: DC
  Filled 2021-03-15: qty 90, fill #0
  Filled 2021-03-28: qty 90, 30d supply, fill #0

## 2021-03-15 NOTE — Patient Instructions (Signed)
-Nice seeing you today!!  -Lab work today; will notify you once results are available.  -tdap today.  -Schedule follow up in 3 months.   Preventive Care 82-40 Years Old, Female Preventive care refers to lifestyle choices and visits with your health care provider that can promote health and wellness. This includes:  A yearly physical exam. This is also called an annual wellness visit.  Regular dental and eye exams.  Immunizations.  Screening for certain conditions.  Healthy lifestyle choices, such as: ? Eating a healthy diet. ? Getting regular exercise. ? Not using drugs or products that contain nicotine and tobacco. ? Limiting alcohol use. What can I expect for my preventive care visit? Physical exam Your health care provider may check your:  Height and weight. These may be used to calculate your BMI (body mass index). BMI is a measurement that tells if you are at a healthy weight.  Heart rate and blood pressure.  Body temperature.  Skin for abnormal spots. Counseling Your health care provider may ask you questions about your:  Past medical problems.  Family's medical history.  Alcohol, tobacco, and drug use.  Emotional well-being.  Home life and relationship well-being.  Sexual activity.  Diet, exercise, and sleep habits.  Work and work Statistician.  Access to firearms.  Method of birth control.  Menstrual cycle.  Pregnancy history. What immunizations do I need? Vaccines are usually given at various ages, according to a schedule. Your health care provider will recommend vaccines for you based on your age, medical history, and lifestyle or other factors, such as travel or where you work.   What tests do I need? Blood tests  Lipid and cholesterol levels. These may be checked every 5 years starting at age 52.  Hepatitis C test.  Hepatitis B test. Screening  Diabetes screening. This is done by checking your blood sugar (glucose) after you have  not eaten for a while (fasting).  STD (sexually transmitted disease) testing, if you are at risk.  BRCA-related cancer screening. This may be done if you have a family history of breast, ovarian, tubal, or peritoneal cancers.  Pelvic exam and Pap test. This may be done every 3 years starting at age 33. Starting at age 37, this may be done every 5 years if you have a Pap test in combination with an HPV test. Talk with your health care provider about your test results, treatment options, and if necessary, the need for more tests.   Follow these instructions at home: Eating and drinking  Eat a healthy diet that includes fresh fruits and vegetables, whole grains, lean protein, and low-fat dairy products.  Take vitamin and mineral supplements as recommended by your health care provider.  Do not drink alcohol if: ? Your health care provider tells you not to drink. ? You are pregnant, may be pregnant, or are planning to become pregnant.  If you drink alcohol: ? Limit how much you have to 0-1 drink a day. ? Be aware of how much alcohol is in your drink. In the [place] one drink equals one 12 oz bottle of beer (355 mL), one 5 oz glass of wine (148 mL), or one 1 oz glass of hard liquor (44 mL).   Lifestyle  Take daily care of your teeth and gums. Brush your teeth every morning and night with fluoride toothpaste. Floss one time each day.  Stay active. Exercise for at least 30 minutes 5 or more days each week.  Do not use  any products that contain nicotine or tobacco, such as cigarettes, e-cigarettes, and chewing tobacco. If you need help quitting, ask your health care provider.  Do not use drugs.  If you are sexually active, practice safe sex. Use a condom or other form of protection to prevent STIs (sexually transmitted infections).  If you do not wish to become pregnant, use a form of birth control. If you plan to become pregnant, see your health care provider for a prepregnancy  visit.  Find healthy ways to cope with stress, such as: ? Meditation, yoga, or listening to music. ? Journaling. ? Talking to a trusted person. ? Spending time with friends and family. Safety  Always wear your seat belt while driving or riding in a vehicle.  Do not drive: ? If you have been drinking alcohol. Do not ride with someone who has been drinking. ? When you are tired or distracted. ? While texting.  Wear a helmet and other protective equipment during sports activities.  If you have firearms in your house, make sure you follow all gun safety procedures.  Seek help if you have been physically or sexually abused. What's next?  Go to your health care provider once a year for an annual wellness visit.  Ask your health care provider how often you should have your eyes and teeth checked.  Stay up to date on all vaccines. This information is not intended to replace advice given to you by your health care provider. Make sure you discuss any questions you have with your health care provider. Document Revised: 06/17/2020 Document Reviewed: 07/01/2018 Elsevier Patient Education  2021 Elsevier Inc.  

## 2021-03-15 NOTE — Addendum Note (Signed)
Addended by: Kern Reap B on: 03/15/2021 11:42 AM   Modules accepted: Orders

## 2021-03-15 NOTE — Addendum Note (Signed)
Addended by: Leonette Nutting on: 03/15/2021 10:49 AM   Modules accepted: Orders
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
## 2021-03-18 ENCOUNTER — Other Ambulatory Visit (HOSPITAL_COMMUNITY): Payer: Self-pay

## 2021-03-18 LAB — ANTI-NUCLEAR AB-TITER (ANA TITER)
ANA TITER: 1:160 {titer} — ABNORMAL HIGH
ANA Titer 1: 1:40 {titer} — ABNORMAL HIGH

## 2021-03-18 LAB — ANA: Anti Nuclear Antibody (ANA): POSITIVE — AB

## 2021-03-19 ENCOUNTER — Other Ambulatory Visit (HOSPITAL_COMMUNITY): Payer: Self-pay

## 2021-03-19 ENCOUNTER — Encounter: Payer: Self-pay | Admitting: Internal Medicine

## 2021-03-19 LAB — ANA+ENA+DNA/DS+ANTICH+CENTR
ANA Titer 1: POSITIVE — AB
Anti JO-1: <0.2 AI (ref 0.0–0.9)
Centromere Ab Screen: <0.2 AI (ref 0.0–0.9)
Chromatin Ab SerPl-aCnc: <0.2 AI (ref 0.0–0.9)
ENA RNP Ab: 0.2 AI (ref 0.0–0.9)
ENA SM Ab Ser-aCnc: <0.2 AI (ref 0.0–0.9)
ENA SSA (RO) Ab: <0.2 AI (ref 0.0–0.9)
ENA SSB (LA) Ab: <0.2 AI (ref 0.0–0.9)
Scleroderma (Scl-70) (ENA) Antibody, IgG: <0.2 AI (ref 0.0–0.9)
dsDNA Ab: 1 IU/mL (ref 0–9)

## 2021-03-19 LAB — FANA STAINING PATTERNS: Nucleolar Pattern: 1:80 {titer}

## 2021-03-21 ENCOUNTER — Other Ambulatory Visit (HOSPITAL_BASED_OUTPATIENT_CLINIC_OR_DEPARTMENT_OTHER): Payer: Self-pay

## 2021-03-22 ENCOUNTER — Other Ambulatory Visit (HOSPITAL_COMMUNITY): Payer: Self-pay

## 2021-03-23 ENCOUNTER — Other Ambulatory Visit (HOSPITAL_COMMUNITY): Payer: Self-pay

## 2021-03-25 ENCOUNTER — Other Ambulatory Visit (HOSPITAL_COMMUNITY): Payer: Self-pay

## 2021-03-26 ENCOUNTER — Other Ambulatory Visit (HOSPITAL_COMMUNITY): Payer: Self-pay

## 2021-03-26 ENCOUNTER — Encounter: Payer: Self-pay | Admitting: *Deleted

## 2021-03-27 ENCOUNTER — Other Ambulatory Visit (HOSPITAL_COMMUNITY): Payer: Self-pay

## 2021-03-28 ENCOUNTER — Other Ambulatory Visit (HOSPITAL_COMMUNITY): Payer: Self-pay

## 2021-03-29 ENCOUNTER — Other Ambulatory Visit (HOSPITAL_COMMUNITY): Payer: Self-pay

## 2021-04-08 ENCOUNTER — Other Ambulatory Visit (HOSPITAL_COMMUNITY): Payer: Self-pay

## 2021-04-11 ENCOUNTER — Other Ambulatory Visit (HOSPITAL_COMMUNITY): Payer: Self-pay

## 2021-04-12 ENCOUNTER — Other Ambulatory Visit (HOSPITAL_COMMUNITY): Payer: Self-pay

## 2021-04-15 ENCOUNTER — Other Ambulatory Visit (HOSPITAL_COMMUNITY): Payer: Self-pay

## 2021-04-22 ENCOUNTER — Other Ambulatory Visit (HOSPITAL_COMMUNITY): Payer: Self-pay

## 2021-04-24 ENCOUNTER — Other Ambulatory Visit (HOSPITAL_COMMUNITY): Payer: Self-pay

## 2021-05-18 ENCOUNTER — Other Ambulatory Visit (HOSPITAL_COMMUNITY): Payer: Self-pay

## 2021-05-18 ENCOUNTER — Other Ambulatory Visit: Payer: Self-pay | Admitting: Internal Medicine

## 2021-05-18 DIAGNOSIS — G4700 Insomnia, unspecified: Secondary | ICD-10-CM

## 2021-05-20 ENCOUNTER — Other Ambulatory Visit (HOSPITAL_COMMUNITY): Payer: Self-pay

## 2021-05-21 ENCOUNTER — Other Ambulatory Visit (HOSPITAL_COMMUNITY): Payer: Self-pay

## 2021-05-21 MED ORDER — ALPRAZOLAM 0.5 MG PO TABS
ORAL_TABLET | ORAL | 0 refills | Status: DC
Start: 2021-05-21 — End: 2021-06-14
  Filled 2021-05-21: qty 45, 45d supply, fill #0

## 2021-05-22 ENCOUNTER — Other Ambulatory Visit (HOSPITAL_COMMUNITY): Payer: Self-pay

## 2021-05-30 ENCOUNTER — Other Ambulatory Visit (HOSPITAL_BASED_OUTPATIENT_CLINIC_OR_DEPARTMENT_OTHER): Payer: Self-pay

## 2021-06-12 ENCOUNTER — Encounter: Payer: No Typology Code available for payment source | Admitting: Internal Medicine

## 2021-06-14 ENCOUNTER — Telehealth (INDEPENDENT_AMBULATORY_CARE_PROVIDER_SITE_OTHER): Payer: No Typology Code available for payment source | Admitting: Internal Medicine

## 2021-06-14 ENCOUNTER — Other Ambulatory Visit (HOSPITAL_COMMUNITY): Payer: Self-pay

## 2021-06-14 ENCOUNTER — Encounter: Payer: Self-pay | Admitting: Internal Medicine

## 2021-06-14 DIAGNOSIS — G8929 Other chronic pain: Secondary | ICD-10-CM

## 2021-06-14 DIAGNOSIS — F909 Attention-deficit hyperactivity disorder, unspecified type: Secondary | ICD-10-CM | POA: Diagnosis not present

## 2021-06-14 DIAGNOSIS — M5440 Lumbago with sciatica, unspecified side: Secondary | ICD-10-CM | POA: Diagnosis not present

## 2021-06-14 DIAGNOSIS — G4700 Insomnia, unspecified: Secondary | ICD-10-CM | POA: Diagnosis not present

## 2021-06-14 MED ORDER — AMPHETAMINE-DEXTROAMPHETAMINE 10 MG PO TABS
ORAL_TABLET | ORAL | 0 refills | Status: DC
Start: 2021-06-14 — End: 2021-09-12
  Filled 2021-06-14: qty 60, fill #0
  Filled 2021-06-18: qty 60, 30d supply, fill #0

## 2021-06-14 MED ORDER — HYDROCODONE-ACETAMINOPHEN 5-325 MG PO TABS
ORAL_TABLET | ORAL | 0 refills | Status: DC
  Filled 2021-06-14: qty 90, fill #0
  Filled 2021-06-18: qty 90, 30d supply, fill #0

## 2021-06-14 MED ORDER — AMPHETAMINE-DEXTROAMPHET ER 30 MG PO CP24
30.0000 mg | ORAL_CAPSULE | Freq: Every morning | ORAL | 0 refills | Status: DC
  Filled 2021-06-14 – 2021-07-16 (×2): qty 30, 30d supply, fill #0

## 2021-06-14 MED ORDER — ALPRAZOLAM 0.5 MG PO TABS
ORAL_TABLET | ORAL | 0 refills | Status: DC
Start: 2021-06-14 — End: 2021-09-12
  Filled 2021-06-14: qty 45, fill #0
  Filled 2021-07-16: qty 45, 45d supply, fill #0

## 2021-06-14 MED ORDER — AMPHETAMINE-DEXTROAMPHETAMINE 10 MG PO TABS
ORAL_TABLET | ORAL | 0 refills | Status: DC
Start: 2021-06-14 — End: 2022-06-02
  Filled 2021-06-14 – 2021-08-13 (×4): qty 60, 30d supply, fill #0

## 2021-06-14 MED ORDER — AMPHETAMINE-DEXTROAMPHET ER 30 MG PO CP24
ORAL_CAPSULE | ORAL | 0 refills | Status: DC
  Filled 2021-06-14 – 2021-06-18 (×2): qty 30, 30d supply, fill #0

## 2021-06-14 MED ORDER — HYDROCODONE-ACETAMINOPHEN 5-325 MG PO TABS
ORAL_TABLET | ORAL | 0 refills | Status: DC
  Filled 2021-06-14: qty 90, 30d supply, fill #0
  Filled 2021-08-10: qty 90, 90d supply, fill #0
  Filled 2021-08-12 – 2021-08-13 (×2): qty 90, 30d supply, fill #0

## 2021-06-14 MED ORDER — AMPHETAMINE-DEXTROAMPHETAMINE 10 MG PO TABS
ORAL_TABLET | ORAL | 0 refills | Status: DC
Start: 2021-06-14 — End: 2021-12-12
  Filled 2021-06-14: qty 60, fill #0
  Filled 2021-07-16: qty 60, 30d supply, fill #0

## 2021-06-14 MED ORDER — HYDROCODONE-ACETAMINOPHEN 5-325 MG PO TABS
ORAL_TABLET | ORAL | 0 refills | Status: DC
Start: 2021-06-14 — End: 2021-09-12
  Filled 2021-06-14: qty 90, fill #0
  Filled 2021-07-16: qty 90, 30d supply, fill #0

## 2021-06-14 MED ORDER — AMPHETAMINE-DEXTROAMPHET ER 30 MG PO CP24
30.0000 mg | ORAL_CAPSULE | Freq: Every morning | ORAL | 0 refills | Status: DC
Start: 2021-06-14 — End: 2021-09-12
  Filled 2021-06-14 – 2021-08-13 (×4): qty 30, 30d supply, fill #0
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
## 2021-06-15 ENCOUNTER — Other Ambulatory Visit (HOSPITAL_BASED_OUTPATIENT_CLINIC_OR_DEPARTMENT_OTHER): Payer: Self-pay

## 2021-06-18 ENCOUNTER — Other Ambulatory Visit (HOSPITAL_COMMUNITY): Payer: Self-pay

## 2021-06-21 ENCOUNTER — Other Ambulatory Visit (HOSPITAL_COMMUNITY): Payer: Self-pay

## 2021-06-22 ENCOUNTER — Other Ambulatory Visit (HOSPITAL_COMMUNITY): Payer: Self-pay

## 2021-07-09 ENCOUNTER — Other Ambulatory Visit (HOSPITAL_BASED_OUTPATIENT_CLINIC_OR_DEPARTMENT_OTHER): Payer: Self-pay

## 2021-07-16 ENCOUNTER — Other Ambulatory Visit (HOSPITAL_COMMUNITY): Payer: Self-pay

## 2021-07-16 ENCOUNTER — Other Ambulatory Visit (HOSPITAL_BASED_OUTPATIENT_CLINIC_OR_DEPARTMENT_OTHER): Payer: Self-pay

## 2021-07-16 ENCOUNTER — Other Ambulatory Visit: Payer: Self-pay | Admitting: Internal Medicine

## 2021-07-16 DIAGNOSIS — J45909 Unspecified asthma, uncomplicated: Secondary | ICD-10-CM

## 2021-07-16 MED ORDER — ALBUTEROL SULFATE HFA 108 (90 BASE) MCG/ACT IN AERS
2.0000 | INHALATION_SPRAY | Freq: Four times a day (QID) | RESPIRATORY_TRACT | 2 refills | Status: DC | PRN
  Filled 2021-07-16: qty 8.5, 25d supply, fill #0

## 2021-08-10 ENCOUNTER — Other Ambulatory Visit (HOSPITAL_COMMUNITY): Payer: Self-pay

## 2021-08-12 ENCOUNTER — Other Ambulatory Visit (HOSPITAL_COMMUNITY): Payer: Self-pay

## 2021-08-13 ENCOUNTER — Other Ambulatory Visit (HOSPITAL_COMMUNITY): Payer: Self-pay

## 2021-08-13 ENCOUNTER — Other Ambulatory Visit (HOSPITAL_BASED_OUTPATIENT_CLINIC_OR_DEPARTMENT_OTHER): Payer: Self-pay

## 2021-08-15 ENCOUNTER — Other Ambulatory Visit (HOSPITAL_COMMUNITY): Payer: Self-pay

## 2021-09-12 ENCOUNTER — Telehealth (INDEPENDENT_AMBULATORY_CARE_PROVIDER_SITE_OTHER): Payer: No Typology Code available for payment source | Admitting: Internal Medicine

## 2021-09-12 ENCOUNTER — Other Ambulatory Visit (HOSPITAL_COMMUNITY): Payer: Self-pay

## 2021-09-12 ENCOUNTER — Encounter: Payer: Self-pay | Admitting: Internal Medicine

## 2021-09-12 VITALS — Wt 135.0 lb

## 2021-09-12 DIAGNOSIS — F330 Major depressive disorder, recurrent, mild: Secondary | ICD-10-CM | POA: Diagnosis not present

## 2021-09-12 DIAGNOSIS — J45909 Unspecified asthma, uncomplicated: Secondary | ICD-10-CM

## 2021-09-12 DIAGNOSIS — G8929 Other chronic pain: Secondary | ICD-10-CM

## 2021-09-12 DIAGNOSIS — F909 Attention-deficit hyperactivity disorder, unspecified type: Secondary | ICD-10-CM | POA: Diagnosis not present

## 2021-09-12 DIAGNOSIS — G4700 Insomnia, unspecified: Secondary | ICD-10-CM | POA: Diagnosis not present

## 2021-09-12 DIAGNOSIS — M5440 Lumbago with sciatica, unspecified side: Secondary | ICD-10-CM

## 2021-09-12 MED ORDER — AMPHETAMINE-DEXTROAMPHET ER 30 MG PO CP24
30.0000 mg | ORAL_CAPSULE | Freq: Every morning | ORAL | 0 refills | Status: DC
  Filled 2021-09-12 – 2021-11-10 (×2): qty 30, 30d supply, fill #0

## 2021-09-12 MED ORDER — ALBUTEROL SULFATE HFA 108 (90 BASE) MCG/ACT IN AERS
2.0000 | INHALATION_SPRAY | Freq: Four times a day (QID) | RESPIRATORY_TRACT | 2 refills | Status: DC | PRN
  Filled 2021-09-12: qty 18, 25d supply, fill #0
  Filled 2021-11-10: qty 18, 25d supply, fill #1

## 2021-09-12 MED ORDER — HYDROCODONE-ACETAMINOPHEN 5-325 MG PO TABS
ORAL_TABLET | ORAL | 0 refills | Status: DC
  Filled 2021-09-12 – 2021-09-15 (×2): qty 90, fill #0
  Filled 2021-11-10: qty 90, 30d supply, fill #0

## 2021-09-12 MED ORDER — AMPHETAMINE-DEXTROAMPHET ER 30 MG PO CP24
30.0000 mg | ORAL_CAPSULE | Freq: Every morning | ORAL | 0 refills | Status: DC
Start: 2021-09-12 — End: 2021-12-12
  Filled 2021-09-12 – 2021-10-11 (×2): qty 30, 30d supply, fill #0

## 2021-09-12 MED ORDER — AMPHETAMINE-DEXTROAMPHET ER 30 MG PO CP24
ORAL_CAPSULE | ORAL | 0 refills | Status: DC
  Filled 2021-09-12: qty 30, 30d supply, fill #0

## 2021-09-12 MED ORDER — BUPROPION HCL ER (SR) 150 MG PO TB12
ORAL_TABLET | Freq: Two times a day (BID) | ORAL | 1 refills | Status: DC
  Filled 2021-09-12: qty 180, 90d supply, fill #0

## 2021-09-12 MED ORDER — ALPRAZOLAM 0.5 MG PO TABS
ORAL_TABLET | ORAL | 0 refills | Status: DC
Start: 2021-09-12 — End: 2021-11-10
  Filled 2021-09-12: qty 45, 45d supply, fill #0

## 2021-09-12 MED ORDER — HYDROCODONE-ACETAMINOPHEN 5-325 MG PO TABS
ORAL_TABLET | ORAL | 0 refills | Status: DC
  Filled 2021-09-12: qty 90, 30d supply, fill #0

## 2021-09-12 MED ORDER — AMPHETAMINE-DEXTROAMPHETAMINE 10 MG PO TABS
ORAL_TABLET | ORAL | 0 refills | Status: DC
  Filled 2021-09-12: qty 60, 30d supply, fill #0

## 2021-09-12 MED ORDER — AMPHETAMINE-DEXTROAMPHETAMINE 10 MG PO TABS
ORAL_TABLET | ORAL | 0 refills | Status: DC
  Filled 2021-09-12: qty 60, fill #0
  Filled 2021-11-10: qty 60, 30d supply, fill #0

## 2021-09-12 MED ORDER — CETIRIZINE HCL 10 MG PO TABS
ORAL_TABLET | Freq: Every day | ORAL | 1 refills | Status: DC
  Filled 2021-09-12: qty 90, 90d supply, fill #0
  Filled 2021-12-24: qty 90, 90d supply, fill #1

## 2021-09-12 MED ORDER — GABAPENTIN 300 MG PO CAPS
ORAL_CAPSULE | ORAL | 1 refills | Status: DC
  Filled 2021-09-12: qty 270, 90d supply, fill #0
  Filled 2021-12-24: qty 270, 90d supply, fill #1

## 2021-09-12 MED ORDER — HYDROCODONE-ACETAMINOPHEN 5-325 MG PO TABS
ORAL_TABLET | ORAL | 0 refills | Status: DC
  Filled 2021-09-12: qty 90, fill #0
  Filled 2021-10-11: qty 90, 30d supply, fill #0

## 2021-09-12 MED ORDER — AMPHETAMINE-DEXTROAMPHETAMINE 10 MG PO TABS
ORAL_TABLET | ORAL | 0 refills | Status: DC
Start: 2021-09-12 — End: 2021-12-12
  Filled 2021-09-12: qty 60, fill #0
  Filled 2021-10-11: qty 60, 30d supply, fill #0
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
## 2021-09-14 ENCOUNTER — Other Ambulatory Visit (HOSPITAL_COMMUNITY): Payer: Self-pay

## 2021-09-15 ENCOUNTER — Other Ambulatory Visit (HOSPITAL_COMMUNITY): Payer: Self-pay

## 2021-09-16 ENCOUNTER — Other Ambulatory Visit (HOSPITAL_COMMUNITY): Payer: Self-pay

## 2021-10-11 ENCOUNTER — Other Ambulatory Visit (HOSPITAL_COMMUNITY): Payer: Self-pay

## 2021-10-14 ENCOUNTER — Other Ambulatory Visit (HOSPITAL_COMMUNITY): Payer: Self-pay

## 2021-11-10 ENCOUNTER — Other Ambulatory Visit: Payer: Self-pay | Admitting: Internal Medicine

## 2021-11-10 DIAGNOSIS — G4700 Insomnia, unspecified: Secondary | ICD-10-CM

## 2021-11-11 ENCOUNTER — Other Ambulatory Visit (HOSPITAL_COMMUNITY): Payer: Self-pay

## 2021-11-12 NOTE — Telephone Encounter (Signed)
Last refill per controlled substance database: 09/12/21 Last OV: VV on 09/12/21 Next OV: none scheduled

## 2021-11-13 ENCOUNTER — Other Ambulatory Visit (HOSPITAL_COMMUNITY): Payer: Self-pay

## 2021-11-14 ENCOUNTER — Other Ambulatory Visit (HOSPITAL_COMMUNITY): Payer: Self-pay

## 2021-11-14 MED ORDER — ALPRAZOLAM 0.5 MG PO TABS
ORAL_TABLET | ORAL | 0 refills | Status: DC
  Filled 2021-11-14: qty 45, 45d supply, fill #0

## 2021-12-12 ENCOUNTER — Encounter: Payer: Self-pay | Admitting: Internal Medicine

## 2021-12-12 ENCOUNTER — Telehealth (INDEPENDENT_AMBULATORY_CARE_PROVIDER_SITE_OTHER): Payer: BC Managed Care – PPO | Admitting: Internal Medicine

## 2021-12-12 ENCOUNTER — Other Ambulatory Visit (HOSPITAL_COMMUNITY): Payer: Self-pay

## 2021-12-12 VITALS — Wt 136.0 lb

## 2021-12-12 DIAGNOSIS — F909 Attention-deficit hyperactivity disorder, unspecified type: Secondary | ICD-10-CM

## 2021-12-12 DIAGNOSIS — F331 Major depressive disorder, recurrent, moderate: Secondary | ICD-10-CM

## 2021-12-12 DIAGNOSIS — M5440 Lumbago with sciatica, unspecified side: Secondary | ICD-10-CM

## 2021-12-12 DIAGNOSIS — G4700 Insomnia, unspecified: Secondary | ICD-10-CM

## 2021-12-12 DIAGNOSIS — G8929 Other chronic pain: Secondary | ICD-10-CM

## 2021-12-12 MED ORDER — AMPHETAMINE-DEXTROAMPHET ER 30 MG PO CP24
30.0000 mg | ORAL_CAPSULE | Freq: Every morning | ORAL | 0 refills | Status: DC
  Filled 2021-12-12 – 2022-01-13 (×2): qty 30, 30d supply, fill #0

## 2021-12-12 MED ORDER — HYDROCODONE-ACETAMINOPHEN 5-325 MG PO TABS
ORAL_TABLET | ORAL | 0 refills | Status: DC
  Filled 2021-12-12: qty 90, 30d supply, fill #0

## 2021-12-12 MED ORDER — HYDROCODONE-ACETAMINOPHEN 5-325 MG PO TABS
ORAL_TABLET | ORAL | 0 refills | Status: DC
  Filled 2021-12-12 – 2022-01-12 (×2): qty 90, fill #0
  Filled 2022-02-10: qty 90, 30d supply, fill #0

## 2021-12-12 MED ORDER — AMPHETAMINE-DEXTROAMPHETAMINE 10 MG PO TABS
ORAL_TABLET | ORAL | 0 refills | Status: DC
  Filled 2021-12-12: qty 60, fill #0
  Filled 2022-01-12: qty 60, 30d supply, fill #0

## 2021-12-12 MED ORDER — ALPRAZOLAM 0.5 MG PO TABS
ORAL_TABLET | ORAL | 0 refills | Status: DC
  Filled 2021-12-12: qty 45, fill #0
  Filled 2022-01-12: qty 45, 45d supply, fill #0

## 2021-12-12 MED ORDER — AMPHETAMINE-DEXTROAMPHETAMINE 10 MG PO TABS
ORAL_TABLET | ORAL | 0 refills | Status: DC
  Filled 2021-12-12: qty 60, fill #0
  Filled 2022-02-10: qty 60, 30d supply, fill #0

## 2021-12-12 MED ORDER — BUPROPION HCL ER (SR) 200 MG PO TB12
200.0000 mg | ORAL_TABLET | Freq: Two times a day (BID) | ORAL | 1 refills | Status: DC
  Filled 2021-12-12: qty 180, 90d supply, fill #0

## 2021-12-12 MED ORDER — AMPHETAMINE-DEXTROAMPHETAMINE 10 MG PO TABS
ORAL_TABLET | ORAL | 0 refills | Status: DC
  Filled 2021-12-12: qty 60, 30d supply, fill #0

## 2021-12-12 MED ORDER — AMPHETAMINE-DEXTROAMPHET ER 30 MG PO CP24
30.0000 mg | ORAL_CAPSULE | Freq: Every morning | ORAL | 0 refills | Status: DC
  Filled 2021-12-12 – 2022-02-10 (×3): qty 30, 30d supply, fill #0

## 2021-12-12 MED ORDER — HYDROCODONE-ACETAMINOPHEN 5-325 MG PO TABS
ORAL_TABLET | ORAL | 0 refills | Status: DC
  Filled 2021-12-12: qty 90, fill #0
  Filled 2022-01-13: qty 90, 30d supply, fill #0

## 2021-12-12 MED ORDER — AMPHETAMINE-DEXTROAMPHET ER 30 MG PO CP24
ORAL_CAPSULE | ORAL | 0 refills | Status: DC
  Filled 2021-12-12: qty 30, 30d supply, fill #0
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
## 2021-12-13 ENCOUNTER — Other Ambulatory Visit (HOSPITAL_COMMUNITY): Payer: Self-pay

## 2021-12-17 ENCOUNTER — Other Ambulatory Visit (HOSPITAL_COMMUNITY): Payer: Self-pay

## 2021-12-17 ENCOUNTER — Telehealth: Payer: Self-pay | Admitting: *Deleted

## 2021-12-17 NOTE — Telephone Encounter (Signed)
PA started for Adderall 30 mg Key [id number]  PA started for  Adderall 10 mg Key: [id number]

## 2021-12-18 NOTE — Telephone Encounter (Signed)
PA for Hydrocodone 5/325 has been approved from 12/13/21 - 06/12/22

## 2021-12-19 ENCOUNTER — Other Ambulatory Visit (HOSPITAL_COMMUNITY): Payer: Self-pay

## 2021-12-19 NOTE — Telephone Encounter (Signed)
PA for Adderall 30 and 10 mg have been approved

## 2021-12-25 ENCOUNTER — Other Ambulatory Visit (HOSPITAL_COMMUNITY): Payer: Self-pay

## 2022-01-13 ENCOUNTER — Other Ambulatory Visit (HOSPITAL_COMMUNITY): Payer: Self-pay

## 2022-02-10 ENCOUNTER — Other Ambulatory Visit (HOSPITAL_COMMUNITY): Payer: Self-pay

## 2022-03-04 ENCOUNTER — Telehealth (INDEPENDENT_AMBULATORY_CARE_PROVIDER_SITE_OTHER): Payer: BC Managed Care – PPO | Admitting: Internal Medicine

## 2022-03-04 ENCOUNTER — Other Ambulatory Visit (HOSPITAL_COMMUNITY): Payer: Self-pay

## 2022-03-04 ENCOUNTER — Encounter: Payer: Self-pay | Admitting: Internal Medicine

## 2022-03-04 DIAGNOSIS — G8929 Other chronic pain: Secondary | ICD-10-CM | POA: Diagnosis not present

## 2022-03-04 DIAGNOSIS — G4700 Insomnia, unspecified: Secondary | ICD-10-CM | POA: Diagnosis not present

## 2022-03-04 DIAGNOSIS — M5440 Lumbago with sciatica, unspecified side: Secondary | ICD-10-CM | POA: Diagnosis not present

## 2022-03-04 DIAGNOSIS — F909 Attention-deficit hyperactivity disorder, unspecified type: Secondary | ICD-10-CM

## 2022-03-04 MED ORDER — HYDROCODONE-ACETAMINOPHEN 5-325 MG PO TABS
ORAL_TABLET | ORAL | 0 refills | Status: DC
  Filled 2022-03-04: qty 90, fill #0
  Filled 2022-04-06: qty 90, 30d supply, fill #0

## 2022-03-04 MED ORDER — HYDROCODONE-ACETAMINOPHEN 5-325 MG PO TABS
ORAL_TABLET | ORAL | 0 refills | Status: DC
  Filled 2022-03-04: qty 90, 30d supply, fill #0
  Filled 2022-04-28 – 2022-04-30 (×2): qty 90, fill #0
  Filled 2022-05-05: qty 90, 30d supply, fill #0

## 2022-03-04 MED ORDER — AMPHETAMINE-DEXTROAMPHETAMINE 10 MG PO TABS
ORAL_TABLET | ORAL | 0 refills | Status: DC
  Filled 2022-03-04 – 2022-04-06 (×2): qty 60, 30d supply, fill #0

## 2022-03-04 MED ORDER — BUPROPION HCL ER (SR) 200 MG PO TB12
200.0000 mg | ORAL_TABLET | Freq: Two times a day (BID) | ORAL | 1 refills | Status: DC
  Filled 2022-03-04: qty 43, 22d supply, fill #0
  Filled 2022-03-06: qty 137, 68d supply, fill #0
  Filled 2022-06-02: qty 180, 90d supply, fill #1

## 2022-03-04 MED ORDER — AMPHETAMINE-DEXTROAMPHETAMINE 10 MG PO TABS
ORAL_TABLET | ORAL | 0 refills | Status: DC
  Filled 2022-03-04 – 2022-04-28 (×2): qty 60, fill #0
  Filled 2022-05-05: qty 60, 30d supply, fill #0

## 2022-03-04 MED ORDER — AMPHETAMINE-DEXTROAMPHET ER 30 MG PO CP24
30.0000 mg | ORAL_CAPSULE | Freq: Every morning | ORAL | 0 refills | Status: DC
  Filled 2022-03-04 – 2022-04-06 (×2): qty 30, 30d supply, fill #0

## 2022-03-04 MED ORDER — AMPHETAMINE-DEXTROAMPHETAMINE 10 MG PO TABS
ORAL_TABLET | ORAL | 0 refills | Status: DC
  Filled 2022-03-04 – 2022-03-06 (×2): qty 60, fill #0
  Filled 2022-03-10: qty 60, 30d supply, fill #0

## 2022-03-04 MED ORDER — AMPHETAMINE-DEXTROAMPHET ER 30 MG PO CP24
30.0000 mg | ORAL_CAPSULE | Freq: Every morning | ORAL | 0 refills | Status: DC
  Filled 2022-03-04 – 2022-05-05 (×4): qty 30, 30d supply, fill #0

## 2022-03-04 MED ORDER — HYDROCODONE-ACETAMINOPHEN 5-325 MG PO TABS
ORAL_TABLET | ORAL | 0 refills | Status: DC
  Filled 2022-03-04 – 2022-03-06 (×2): qty 90, fill #0
  Filled 2022-03-10: qty 90, 30d supply, fill #0

## 2022-03-04 MED ORDER — ALPRAZOLAM 0.5 MG PO TABS
ORAL_TABLET | ORAL | 2 refills | Status: DC
  Filled 2022-03-04: qty 45, 45d supply, fill #0
  Filled 2022-04-28: qty 45, 45d supply, fill #1
  Filled 2022-06-18 – 2022-06-19 (×2): qty 45, 45d supply, fill #2

## 2022-03-04 MED ORDER — AMPHETAMINE-DEXTROAMPHET ER 30 MG PO CP24
ORAL_CAPSULE | ORAL | 0 refills | Status: DC
  Filled 2022-03-04 – 2022-03-06 (×2): qty 30, fill #0
  Filled 2022-03-10: qty 30, 30d supply, fill #0

## 2022-03-04 MED ORDER — GABAPENTIN 300 MG PO CAPS
ORAL_CAPSULE | ORAL | 1 refills | Status: DC
  Filled 2022-03-04: qty 270, 90d supply, fill #0
  Filled 2022-06-02: qty 270, 90d supply, fill #1
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
## 2022-03-06 ENCOUNTER — Other Ambulatory Visit: Payer: Self-pay | Admitting: Internal Medicine

## 2022-03-06 ENCOUNTER — Other Ambulatory Visit (HOSPITAL_COMMUNITY): Payer: Self-pay

## 2022-03-06 DIAGNOSIS — J45909 Unspecified asthma, uncomplicated: Secondary | ICD-10-CM

## 2022-03-06 MED ORDER — CETIRIZINE HCL 10 MG PO TABS
ORAL_TABLET | Freq: Every day | ORAL | 1 refills | Status: DC
  Filled 2022-03-06: qty 90, 90d supply, fill #0
  Filled 2022-06-02: qty 90, 90d supply, fill #1

## 2022-03-10 ENCOUNTER — Other Ambulatory Visit (HOSPITAL_COMMUNITY): Payer: Self-pay

## 2022-04-07 ENCOUNTER — Other Ambulatory Visit (HOSPITAL_COMMUNITY): Payer: Self-pay

## 2022-04-29 ENCOUNTER — Other Ambulatory Visit (HOSPITAL_COMMUNITY): Payer: Self-pay

## 2022-04-29 ENCOUNTER — Encounter: Payer: BC Managed Care – PPO | Admitting: Internal Medicine

## 2022-04-30 ENCOUNTER — Encounter: Payer: BC Managed Care – PPO | Admitting: Internal Medicine

## 2022-05-01 ENCOUNTER — Other Ambulatory Visit (HOSPITAL_COMMUNITY): Payer: Self-pay

## 2022-05-05 ENCOUNTER — Other Ambulatory Visit (HOSPITAL_COMMUNITY): Payer: Self-pay

## 2022-06-02 ENCOUNTER — Other Ambulatory Visit (HOSPITAL_COMMUNITY): Payer: Self-pay

## 2022-06-02 ENCOUNTER — Encounter: Payer: Self-pay | Admitting: Internal Medicine

## 2022-06-02 ENCOUNTER — Ambulatory Visit: Payer: BC Managed Care – PPO | Admitting: Internal Medicine

## 2022-06-02 VITALS — BP 138/88 | HR 81 | Temp 98.3°F | Ht 60.83 in | Wt 139.8 lb

## 2022-06-02 DIAGNOSIS — F900 Attention-deficit hyperactivity disorder, predominantly inattentive type: Secondary | ICD-10-CM

## 2022-06-02 DIAGNOSIS — Z0000 Encounter for general adult medical examination without abnormal findings: Secondary | ICD-10-CM

## 2022-06-02 DIAGNOSIS — F419 Anxiety disorder, unspecified: Secondary | ICD-10-CM | POA: Diagnosis not present

## 2022-06-02 DIAGNOSIS — M5442 Lumbago with sciatica, left side: Secondary | ICD-10-CM | POA: Diagnosis not present

## 2022-06-02 DIAGNOSIS — F909 Attention-deficit hyperactivity disorder, unspecified type: Secondary | ICD-10-CM | POA: Diagnosis not present

## 2022-06-02 DIAGNOSIS — M5440 Lumbago with sciatica, unspecified side: Secondary | ICD-10-CM

## 2022-06-02 DIAGNOSIS — Z1231 Encounter for screening mammogram for malignant neoplasm of breast: Secondary | ICD-10-CM

## 2022-06-02 DIAGNOSIS — F32A Depression, unspecified: Secondary | ICD-10-CM

## 2022-06-02 DIAGNOSIS — G8929 Other chronic pain: Secondary | ICD-10-CM

## 2022-06-02 LAB — COMPREHENSIVE METABOLIC PANEL
ALT: 9 U/L (ref 0–35)
AST: 14 U/L (ref 0–37)
Albumin: 4.6 g/dL (ref 3.5–5.2)
Alkaline Phosphatase: 52 U/L (ref 39–117)
BUN: 14 mg/dL (ref 6–23)
CO2: 29 mEq/L (ref 19–32)
Calcium: 9.3 mg/dL (ref 8.4–10.5)
Chloride: 103 mEq/L (ref 96–112)
Creatinine, Ser: 0.94 mg/dL (ref 0.40–1.20)
GFR: 75.64 mL/min (ref >60.00)
Glucose, Bld: 71 mg/dL (ref 70–99)
Potassium: 4.3 mEq/L (ref 3.5–5.1)
Sodium: 138 mEq/L (ref 135–145)
Total Bilirubin: 1.2 mg/dL (ref 0.2–1.2)
Total Protein: 7.2 g/dL (ref 6.0–8.3)

## 2022-06-02 LAB — HEMOGLOBIN A1C: Hgb A1c MFr Bld: 4.3 % — ABNORMAL LOW (ref 4.6–6.5)

## 2022-06-02 LAB — LIPID PANEL
Cholesterol: 142 mg/dL (ref 0–200)
HDL: 51.9 mg/dL (ref >39.00)
LDL Cholesterol: 78 mg/dL (ref 0–99)
NonHDL: 90.29
Total CHOL/HDL Ratio: 3
Triglycerides: 60 mg/dL (ref 0.0–149.0)
VLDL: 12 mg/dL (ref 0.0–40.0)

## 2022-06-02 LAB — CBC WITH DIFFERENTIAL/PLATELET
Basophils Absolute: 0.1 10*3/uL (ref 0.0–0.1)
Basophils Relative: 1 % (ref 0.0–3.0)
Eosinophils Absolute: 0.1 10*3/uL (ref 0.0–0.7)
Eosinophils Relative: 1.6 % (ref 0.0–5.0)
HCT: 40.2 % (ref 36.0–46.0)
Hemoglobin: 14.1 g/dL (ref 12.0–15.0)
Lymphocytes Relative: 32.3 % (ref 12.0–46.0)
Lymphs Abs: 2.1 10*3/uL (ref 0.7–4.0)
MCHC: 35 g/dL (ref 30.0–36.0)
MCV: 96.3 fl (ref 78.0–100.0)
Monocytes Absolute: 0.5 10*3/uL (ref 0.1–1.0)
Monocytes Relative: 6.9 % (ref 3.0–12.0)
Neutro Abs: 3.8 10*3/uL (ref 1.4–7.7)
Neutrophils Relative %: 58.2 % (ref 43.0–77.0)
Platelets: 317 10*3/uL (ref 150.0–400.0)
RBC: 4.17 Mil/uL (ref 3.87–5.11)
RDW: 11.6 % (ref 11.5–15.5)
WBC: 6.6 10*3/uL (ref 4.0–10.5)

## 2022-06-02 LAB — VITAMIN B12: Vitamin B-12: 515 pg/mL (ref 211–911)

## 2022-06-02 LAB — TSH: TSH: 1.99 u[IU]/mL (ref 0.35–5.50)

## 2022-06-02 LAB — VITAMIN D 25 HYDROXY (VIT D DEFICIENCY, FRACTURES): VITD: 62.01 ng/mL (ref 30.00–100.00)

## 2022-06-02 MED ORDER — HYDROCODONE-ACETAMINOPHEN 5-325 MG PO TABS
ORAL_TABLET | ORAL | 0 refills | Status: DC
  Filled 2022-06-02: qty 90, fill #0
  Filled 2022-06-27: qty 90, 30d supply, fill #0
  Filled 2022-07-28 (×2): qty 90, fill #0
  Filled 2022-07-29: qty 90, 30d supply, fill #0

## 2022-06-02 MED ORDER — AMPHETAMINE-DEXTROAMPHETAMINE 10 MG PO TABS
ORAL_TABLET | ORAL | 0 refills | Status: DC
  Filled 2022-06-02 – 2022-07-28 (×3): qty 60, fill #0
  Filled 2022-08-04: qty 60, 30d supply, fill #0

## 2022-06-02 MED ORDER — AMPHETAMINE-DEXTROAMPHET ER 30 MG PO CP24
ORAL_CAPSULE | ORAL | 0 refills | Status: DC
  Filled 2022-06-02: qty 30, fill #0
  Filled 2022-06-03: qty 30, 30d supply, fill #0

## 2022-06-02 MED ORDER — AMPHETAMINE-DEXTROAMPHETAMINE 10 MG PO TABS
ORAL_TABLET | ORAL | 0 refills | Status: DC
  Filled 2022-06-02: qty 60, fill #0
  Filled 2022-06-03 – 2022-06-30 (×3): qty 60, 30d supply, fill #0

## 2022-06-02 MED ORDER — HYDROCODONE-ACETAMINOPHEN 5-325 MG PO TABS
ORAL_TABLET | ORAL | 0 refills | Status: DC
  Filled 2022-06-02: qty 90, fill #0
  Filled 2022-06-03: qty 90, 30d supply, fill #0

## 2022-06-02 MED ORDER — HYDROCODONE-ACETAMINOPHEN 5-325 MG PO TABS
ORAL_TABLET | ORAL | 0 refills | Status: DC
  Filled 2022-06-02: qty 90, fill #0
  Filled 2022-06-30: qty 90, 30d supply, fill #0

## 2022-06-02 MED ORDER — AMPHETAMINE-DEXTROAMPHETAMINE 10 MG PO TABS
ORAL_TABLET | ORAL | 0 refills | Status: DC
  Filled 2022-06-02: qty 60, fill #0
  Filled 2022-06-03: qty 60, 30d supply, fill #0

## 2022-06-02 MED ORDER — AMPHETAMINE-DEXTROAMPHET ER 30 MG PO CP24
30.0000 mg | ORAL_CAPSULE | Freq: Every morning | ORAL | 0 refills | Status: DC
  Filled 2022-06-02 – 2022-07-29 (×5): qty 30, 30d supply, fill #0

## 2022-06-02 MED ORDER — AMPHETAMINE-DEXTROAMPHET ER 30 MG PO CP24
30.0000 mg | ORAL_CAPSULE | Freq: Every morning | ORAL | 0 refills | Status: DC
  Filled 2022-06-02 – 2022-06-30 (×3): qty 30, 30d supply, fill #0
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
## 2022-06-03 ENCOUNTER — Other Ambulatory Visit (HOSPITAL_COMMUNITY): Payer: Self-pay

## 2022-06-19 ENCOUNTER — Other Ambulatory Visit (HOSPITAL_COMMUNITY): Payer: Self-pay

## 2022-06-27 ENCOUNTER — Other Ambulatory Visit (HOSPITAL_COMMUNITY): Payer: Self-pay

## 2022-06-30 ENCOUNTER — Other Ambulatory Visit (HOSPITAL_COMMUNITY): Payer: Self-pay

## 2022-07-01 ENCOUNTER — Other Ambulatory Visit (HOSPITAL_COMMUNITY): Payer: Self-pay

## 2022-07-28 ENCOUNTER — Other Ambulatory Visit: Payer: Self-pay | Admitting: Internal Medicine

## 2022-07-28 ENCOUNTER — Other Ambulatory Visit (HOSPITAL_COMMUNITY): Payer: Self-pay

## 2022-07-28 DIAGNOSIS — G4700 Insomnia, unspecified: Secondary | ICD-10-CM

## 2022-07-28 MED ORDER — ALPRAZOLAM 0.5 MG PO TABS
ORAL_TABLET | ORAL | 2 refills | Status: DC
  Filled 2022-07-28 – 2022-08-04 (×2): qty 45, 45d supply, fill #0

## 2022-07-29 ENCOUNTER — Other Ambulatory Visit (HOSPITAL_COMMUNITY): Payer: Self-pay

## 2022-07-30 ENCOUNTER — Other Ambulatory Visit (HOSPITAL_COMMUNITY): Payer: Self-pay

## 2022-08-04 ENCOUNTER — Other Ambulatory Visit (HOSPITAL_COMMUNITY): Payer: Self-pay

## 2022-08-25 ENCOUNTER — Telehealth: Payer: BC Managed Care – PPO | Admitting: Internal Medicine

## 2022-08-26 ENCOUNTER — Telehealth: Payer: BC Managed Care – PPO | Admitting: Internal Medicine

## 2022-08-27 ENCOUNTER — Other Ambulatory Visit: Payer: Self-pay | Admitting: Internal Medicine

## 2022-08-27 ENCOUNTER — Encounter: Payer: Self-pay | Admitting: Internal Medicine

## 2022-08-27 ENCOUNTER — Other Ambulatory Visit (HOSPITAL_COMMUNITY): Payer: Self-pay

## 2022-08-27 DIAGNOSIS — J45909 Unspecified asthma, uncomplicated: Secondary | ICD-10-CM

## 2022-08-27 DIAGNOSIS — F909 Attention-deficit hyperactivity disorder, unspecified type: Secondary | ICD-10-CM

## 2022-08-27 DIAGNOSIS — G8929 Other chronic pain: Secondary | ICD-10-CM

## 2022-08-27 MED ORDER — AMPHETAMINE-DEXTROAMPHET ER 30 MG PO CP24
30.0000 mg | ORAL_CAPSULE | Freq: Every morning | ORAL | 0 refills | Status: DC
  Filled 2022-08-27 (×2): qty 10, 10d supply, fill #0

## 2022-08-27 MED ORDER — CETIRIZINE HCL 10 MG PO TABS
10.0000 mg | ORAL_TABLET | Freq: Every day | ORAL | 0 refills | Status: DC
  Filled 2022-08-27: qty 30, 30d supply, fill #0

## 2022-08-27 MED ORDER — AMPHETAMINE-DEXTROAMPHETAMINE 10 MG PO TABS
ORAL_TABLET | ORAL | 0 refills | Status: DC
  Filled 2022-08-27: qty 30, fill #0
  Filled 2022-08-27: qty 30, 15d supply, fill #0

## 2022-08-27 MED ORDER — HYDROCODONE-ACETAMINOPHEN 5-325 MG PO TABS
ORAL_TABLET | ORAL | 0 refills | Status: DC
  Filled 2022-08-27: qty 30, 10d supply, fill #0

## 2022-08-27 MED ORDER — BUPROPION HCL ER (SR) 200 MG PO TB12
200.0000 mg | ORAL_TABLET | Freq: Two times a day (BID) | ORAL | 0 refills | Status: DC
Start: 2022-08-27 — End: 2022-09-11
  Filled 2022-08-27: qty 30, 15d supply, fill #0

## 2022-08-27 MED ORDER — GABAPENTIN 300 MG PO CAPS
ORAL_CAPSULE | ORAL | 0 refills | Status: DC
  Filled 2022-08-27: qty 45, 15d supply, fill #0

## 2022-08-28 ENCOUNTER — Other Ambulatory Visit (HOSPITAL_COMMUNITY): Payer: Self-pay

## 2022-09-11 ENCOUNTER — Other Ambulatory Visit (HOSPITAL_COMMUNITY): Payer: Self-pay

## 2022-09-11 ENCOUNTER — Encounter: Payer: Self-pay | Admitting: Internal Medicine

## 2022-09-11 ENCOUNTER — Telehealth (INDEPENDENT_AMBULATORY_CARE_PROVIDER_SITE_OTHER): Payer: BC Managed Care – PPO | Admitting: Internal Medicine

## 2022-09-11 VITALS — Wt 137.0 lb

## 2022-09-11 DIAGNOSIS — F331 Major depressive disorder, recurrent, moderate: Secondary | ICD-10-CM | POA: Diagnosis not present

## 2022-09-11 DIAGNOSIS — F909 Attention-deficit hyperactivity disorder, unspecified type: Secondary | ICD-10-CM | POA: Diagnosis not present

## 2022-09-11 DIAGNOSIS — M5440 Lumbago with sciatica, unspecified side: Secondary | ICD-10-CM

## 2022-09-11 DIAGNOSIS — J45909 Unspecified asthma, uncomplicated: Secondary | ICD-10-CM

## 2022-09-11 DIAGNOSIS — G4700 Insomnia, unspecified: Secondary | ICD-10-CM

## 2022-09-11 DIAGNOSIS — G8929 Other chronic pain: Secondary | ICD-10-CM

## 2022-09-11 MED ORDER — ALBUTEROL SULFATE HFA 108 (90 BASE) MCG/ACT IN AERS
2.0000 | INHALATION_SPRAY | Freq: Four times a day (QID) | RESPIRATORY_TRACT | 2 refills | Status: DC | PRN
  Filled 2022-09-11: qty 6.7, 25d supply, fill #0
  Filled 2023-07-16: qty 6.7, 25d supply, fill #1

## 2022-09-11 MED ORDER — HYDROCODONE-ACETAMINOPHEN 5-325 MG PO TABS
ORAL_TABLET | ORAL | 0 refills | Status: DC
  Filled 2022-09-11 – 2022-10-13 (×2): qty 90, 30d supply, fill #0

## 2022-09-11 MED ORDER — HYDROCODONE-ACETAMINOPHEN 5-325 MG PO TABS
1.0000 | ORAL_TABLET | Freq: Three times a day (TID) | ORAL | 0 refills | Status: DC
  Filled 2022-09-11: qty 90, 30d supply, fill #0
  Filled 2022-09-12: qty 90, fill #0
  Filled 2022-11-10: qty 90, 30d supply, fill #0

## 2022-09-11 MED ORDER — ALPRAZOLAM 0.5 MG PO TABS
0.5000 mg | ORAL_TABLET | Freq: Every evening | ORAL | 2 refills | Status: DC | PRN
  Filled 2022-09-11: qty 30, 30d supply, fill #0
  Filled 2022-10-13: qty 30, 30d supply, fill #1
  Filled 2022-11-10: qty 30, 30d supply, fill #2

## 2022-09-11 MED ORDER — AMPHETAMINE-DEXTROAMPHET ER 30 MG PO CP24
ORAL_CAPSULE | ORAL | 0 refills | Status: DC
  Filled 2022-09-11: qty 30, 30d supply, fill #0

## 2022-09-11 MED ORDER — AMPHETAMINE-DEXTROAMPHETAMINE 10 MG PO TABS
ORAL_TABLET | ORAL | 0 refills | Status: DC
  Filled 2022-09-11 – 2022-11-10 (×2): qty 60, 30d supply, fill #0

## 2022-09-11 MED ORDER — AMPHETAMINE-DEXTROAMPHET ER 30 MG PO CP24
30.0000 mg | ORAL_CAPSULE | Freq: Every morning | ORAL | 0 refills | Status: DC
  Filled 2022-09-11 – 2022-10-13 (×3): qty 30, 30d supply, fill #0

## 2022-09-11 MED ORDER — CETIRIZINE HCL 10 MG PO TABS
10.0000 mg | ORAL_TABLET | Freq: Every day | ORAL | 0 refills | Status: DC
  Filled 2022-09-11: qty 30, 30d supply, fill #0

## 2022-09-11 MED ORDER — AMPHETAMINE-DEXTROAMPHET ER 30 MG PO CP24
30.0000 mg | ORAL_CAPSULE | Freq: Every morning | ORAL | 0 refills | Status: DC
  Filled 2022-09-11 – 2022-11-10 (×2): qty 30, 30d supply, fill #0

## 2022-09-11 MED ORDER — BUPROPION HCL ER (SR) 200 MG PO TB12
200.0000 mg | ORAL_TABLET | Freq: Two times a day (BID) | ORAL | 0 refills | Status: DC
  Filled 2022-09-11: qty 30, 15d supply, fill #0

## 2022-09-11 MED ORDER — AMPHETAMINE-DEXTROAMPHETAMINE 10 MG PO TABS
20.0000 mg | ORAL_TABLET | Freq: Every evening | ORAL | 0 refills | Status: DC
  Filled 2022-09-11: qty 60, 30d supply, fill #0
  Filled 2022-09-12: qty 60, fill #0
  Filled 2022-10-13: qty 60, 30d supply, fill #0

## 2022-09-11 MED ORDER — GABAPENTIN 300 MG PO CAPS
ORAL_CAPSULE | ORAL | 1 refills | Status: DC
  Filled 2022-09-11: qty 45, 15d supply, fill #0

## 2022-09-11 MED ORDER — HYDROCODONE-ACETAMINOPHEN 5-325 MG PO TABS
ORAL_TABLET | ORAL | 0 refills | Status: DC
  Filled 2022-09-11: qty 90, 30d supply, fill #0

## 2022-09-11 MED ORDER — AMPHETAMINE-DEXTROAMPHETAMINE 10 MG PO TABS
20.0000 mg | ORAL_TABLET | Freq: Every day | ORAL | 0 refills | Status: DC
  Filled 2022-09-11: qty 60, 30d supply, fill #0
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
## 2022-09-12 ENCOUNTER — Other Ambulatory Visit (HOSPITAL_COMMUNITY): Payer: Self-pay

## 2022-09-13 ENCOUNTER — Other Ambulatory Visit (HOSPITAL_COMMUNITY): Payer: Self-pay

## 2022-09-15 ENCOUNTER — Other Ambulatory Visit (HOSPITAL_COMMUNITY): Payer: Self-pay

## 2022-09-15 MED ORDER — BUPROPION HCL ER (SR) 200 MG PO TB12
200.0000 mg | ORAL_TABLET | Freq: Two times a day (BID) | ORAL | 1 refills | Status: DC
  Filled 2022-09-15 (×2): qty 180, 90d supply, fill #0
  Filled 2022-12-08: qty 180, 90d supply, fill #1
  Filled 2022-12-09: qty 60, 30d supply, fill #1

## 2022-09-15 MED ORDER — CETIRIZINE HCL 10 MG PO TABS
10.0000 mg | ORAL_TABLET | Freq: Every day | ORAL | 1 refills | Status: DC
  Filled 2022-09-15: qty 90, 90d supply, fill #0
  Filled 2023-01-05: qty 30, 30d supply, fill #1
  Filled 2023-02-05: qty 30, 30d supply, fill #2

## 2022-09-15 MED ORDER — GABAPENTIN 300 MG PO CAPS
ORAL_CAPSULE | ORAL | 1 refills | Status: DC
  Filled 2022-09-15: qty 180, 60d supply, fill #0
  Filled 2022-11-10: qty 180, 60d supply, fill #1
  Filled 2022-11-10: qty 90, 30d supply, fill #1
  Filled 2022-12-08 – 2022-12-09 (×2): qty 90, 30d supply, fill #2

## 2022-09-16 ENCOUNTER — Other Ambulatory Visit (HOSPITAL_COMMUNITY): Payer: Self-pay

## 2022-09-17 ENCOUNTER — Other Ambulatory Visit (HOSPITAL_COMMUNITY): Payer: Self-pay

## 2022-10-13 ENCOUNTER — Other Ambulatory Visit (HOSPITAL_COMMUNITY): Payer: Self-pay

## 2022-11-10 ENCOUNTER — Other Ambulatory Visit: Payer: Self-pay

## 2022-11-10 ENCOUNTER — Other Ambulatory Visit (HOSPITAL_COMMUNITY): Payer: Self-pay

## 2022-11-11 ENCOUNTER — Other Ambulatory Visit: Payer: Self-pay

## 2022-11-11 ENCOUNTER — Other Ambulatory Visit (HOSPITAL_COMMUNITY): Payer: Self-pay

## 2022-11-25 NOTE — Progress Notes (Signed)
This encounter was created in error - please disregard.

## 2022-12-04 ENCOUNTER — Encounter: Payer: Self-pay | Admitting: Internal Medicine

## 2022-12-04 ENCOUNTER — Telehealth: Payer: Managed Care, Other (non HMO) | Admitting: Internal Medicine

## 2022-12-04 ENCOUNTER — Other Ambulatory Visit: Payer: Self-pay

## 2022-12-04 ENCOUNTER — Other Ambulatory Visit (HOSPITAL_COMMUNITY): Payer: Self-pay

## 2022-12-04 DIAGNOSIS — F909 Attention-deficit hyperactivity disorder, unspecified type: Secondary | ICD-10-CM | POA: Diagnosis not present

## 2022-12-04 DIAGNOSIS — M5440 Lumbago with sciatica, unspecified side: Secondary | ICD-10-CM | POA: Diagnosis not present

## 2022-12-04 DIAGNOSIS — G4700 Insomnia, unspecified: Secondary | ICD-10-CM

## 2022-12-04 DIAGNOSIS — G8929 Other chronic pain: Secondary | ICD-10-CM

## 2022-12-04 MED ORDER — AMPHETAMINE-DEXTROAMPHETAMINE 10 MG PO TABS
20.0000 mg | ORAL_TABLET | Freq: Every day | ORAL | 0 refills | Status: DC
  Filled 2022-12-04 – 2022-12-08 (×3): qty 60, 30d supply, fill #0

## 2022-12-04 MED ORDER — AMPHETAMINE-DEXTROAMPHET ER 30 MG PO CP24
30.0000 mg | ORAL_CAPSULE | Freq: Every morning | ORAL | 0 refills | Status: DC
  Filled 2022-12-04 – 2023-01-08 (×2): qty 30, 30d supply, fill #0

## 2022-12-04 MED ORDER — HYDROCODONE-ACETAMINOPHEN 5-325 MG PO TABS
1.0000 | ORAL_TABLET | Freq: Three times a day (TID) | ORAL | 0 refills | Status: DC
  Filled 2022-12-04 – 2022-12-10 (×3): qty 90, 30d supply, fill #0

## 2022-12-04 MED ORDER — AMPHETAMINE-DEXTROAMPHET ER 30 MG PO CP24
30.0000 mg | ORAL_CAPSULE | Freq: Every morning | ORAL | 0 refills | Status: DC
  Filled 2022-12-04 – 2023-02-14 (×6): qty 30, 30d supply, fill #0
  Filled ????-??-??: fill #0

## 2022-12-04 MED ORDER — HYDROCODONE-ACETAMINOPHEN 5-325 MG PO TABS
ORAL_TABLET | ORAL | 0 refills | Status: DC
  Filled 2022-12-04 (×2): qty 90, fill #0
  Filled 2022-12-05: qty 90, 30d supply, fill #0
  Filled 2022-12-09 – 2023-01-05 (×3): qty 90, fill #0
  Filled 2023-01-05 – 2023-02-05 (×2): qty 90, 30d supply, fill #0

## 2022-12-04 MED ORDER — AMPHETAMINE-DEXTROAMPHETAMINE 10 MG PO TABS
20.0000 mg | ORAL_TABLET | Freq: Every day | ORAL | 0 refills | Status: DC
  Filled 2022-12-04: qty 60, fill #0
  Filled 2023-01-05: qty 60, 30d supply, fill #0

## 2022-12-04 MED ORDER — ALPRAZOLAM 0.5 MG PO TABS
0.5000 mg | ORAL_TABLET | Freq: Every evening | ORAL | 2 refills | Status: DC | PRN
  Filled 2022-12-04: qty 45, 45d supply, fill #0
  Filled 2023-01-05: qty 30, 30d supply, fill #0
  Filled 2023-02-14: qty 30, 30d supply, fill #1
  Filled 2023-03-06 – 2023-03-14 (×2): qty 30, 30d supply, fill #2
  Filled 2023-04-14: qty 30, 30d supply, fill #3
  Filled 2023-05-15: qty 15, 15d supply, fill #4

## 2022-12-04 MED ORDER — AMPHETAMINE-DEXTROAMPHETAMINE 10 MG PO TABS
20.0000 mg | ORAL_TABLET | Freq: Every evening | ORAL | 0 refills | Status: DC
  Filled 2022-12-04 – 2023-02-05 (×3): qty 60, 30d supply, fill #0

## 2022-12-04 MED ORDER — AMPHETAMINE-DEXTROAMPHET ER 30 MG PO CP24
30.0000 mg | ORAL_CAPSULE | ORAL | 0 refills | Status: DC
  Filled 2022-12-04 – 2022-12-09 (×4): qty 30, 30d supply, fill #0

## 2022-12-04 MED ORDER — HYDROCODONE-ACETAMINOPHEN 5-325 MG PO TABS
ORAL_TABLET | ORAL | 0 refills | Status: DC
  Filled 2022-12-04: qty 90, fill #0
  Filled 2023-01-08: qty 90, 30d supply, fill #0
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
## 2022-12-08 ENCOUNTER — Other Ambulatory Visit: Payer: Self-pay

## 2022-12-09 ENCOUNTER — Other Ambulatory Visit: Payer: Self-pay

## 2022-12-09 ENCOUNTER — Other Ambulatory Visit (HOSPITAL_COMMUNITY): Payer: Self-pay

## 2022-12-10 ENCOUNTER — Other Ambulatory Visit (HOSPITAL_COMMUNITY): Payer: Self-pay

## 2022-12-10 ENCOUNTER — Other Ambulatory Visit: Payer: Self-pay

## 2022-12-11 ENCOUNTER — Other Ambulatory Visit: Payer: Self-pay

## 2023-01-05 ENCOUNTER — Other Ambulatory Visit (HOSPITAL_COMMUNITY): Payer: Self-pay

## 2023-01-05 ENCOUNTER — Encounter: Payer: Self-pay | Admitting: Internal Medicine

## 2023-01-05 ENCOUNTER — Other Ambulatory Visit: Payer: Self-pay

## 2023-01-05 DIAGNOSIS — F331 Major depressive disorder, recurrent, moderate: Secondary | ICD-10-CM

## 2023-01-05 DIAGNOSIS — G8929 Other chronic pain: Secondary | ICD-10-CM

## 2023-01-05 MED ORDER — GABAPENTIN 300 MG PO CAPS
ORAL_CAPSULE | ORAL | 1 refills | Status: DC
  Filled 2023-01-05: qty 90, 30d supply, fill #0
  Filled 2023-02-05: qty 90, 30d supply, fill #1

## 2023-01-05 MED ORDER — BUPROPION HCL ER (SR) 200 MG PO TB12
200.0000 mg | ORAL_TABLET | Freq: Two times a day (BID) | ORAL | 1 refills | Status: DC
  Filled 2023-01-05: qty 60, 30d supply, fill #0
  Filled 2023-02-14: qty 60, 30d supply, fill #1

## 2023-01-06 ENCOUNTER — Other Ambulatory Visit: Payer: Self-pay

## 2023-01-08 ENCOUNTER — Other Ambulatory Visit: Payer: Self-pay

## 2023-01-08 ENCOUNTER — Other Ambulatory Visit (HOSPITAL_COMMUNITY): Payer: Self-pay

## 2023-01-09 ENCOUNTER — Other Ambulatory Visit (HOSPITAL_COMMUNITY): Payer: Self-pay

## 2023-02-05 ENCOUNTER — Other Ambulatory Visit: Payer: Self-pay

## 2023-02-05 ENCOUNTER — Other Ambulatory Visit (HOSPITAL_COMMUNITY): Payer: Self-pay

## 2023-02-16 ENCOUNTER — Other Ambulatory Visit: Payer: Self-pay

## 2023-03-04 ENCOUNTER — Other Ambulatory Visit (HOSPITAL_COMMUNITY): Payer: Self-pay

## 2023-03-04 ENCOUNTER — Encounter: Payer: Self-pay | Admitting: Internal Medicine

## 2023-03-04 ENCOUNTER — Telehealth: Payer: Managed Care, Other (non HMO) | Admitting: Internal Medicine

## 2023-03-04 VITALS — Wt 138.0 lb

## 2023-03-04 DIAGNOSIS — F909 Attention-deficit hyperactivity disorder, unspecified type: Secondary | ICD-10-CM

## 2023-03-04 DIAGNOSIS — F331 Major depressive disorder, recurrent, moderate: Secondary | ICD-10-CM

## 2023-03-04 DIAGNOSIS — M5440 Lumbago with sciatica, unspecified side: Secondary | ICD-10-CM

## 2023-03-04 DIAGNOSIS — H9192 Unspecified hearing loss, left ear: Secondary | ICD-10-CM

## 2023-03-04 DIAGNOSIS — G8929 Other chronic pain: Secondary | ICD-10-CM

## 2023-03-04 DIAGNOSIS — J45909 Unspecified asthma, uncomplicated: Secondary | ICD-10-CM

## 2023-03-04 MED ORDER — BUPROPION HCL ER (SR) 200 MG PO TB12
200.0000 mg | ORAL_TABLET | Freq: Two times a day (BID) | ORAL | 1 refills | Status: DC
Start: 2023-03-04 — End: 2023-05-29
  Filled 2023-03-04: qty 180, 90d supply, fill #0
  Filled 2023-03-14: qty 60, 30d supply, fill #0
  Filled 2023-04-14: qty 60, 30d supply, fill #1
  Filled 2023-05-15: qty 60, 30d supply, fill #2

## 2023-03-04 MED ORDER — CETIRIZINE HCL 10 MG PO TABS
10.0000 mg | ORAL_TABLET | Freq: Every day | ORAL | 1 refills | Status: DC
Start: 2023-03-04 — End: 2023-08-31
  Filled 2023-03-04: qty 90, 90d supply, fill #0
  Filled 2023-06-01: qty 90, 90d supply, fill #1

## 2023-03-04 MED ORDER — AMPHETAMINE-DEXTROAMPHETAMINE 10 MG PO TABS
20.0000 mg | ORAL_TABLET | Freq: Every day | ORAL | 0 refills | Status: DC
Start: 2023-03-04 — End: 2023-11-18
  Filled 2023-03-04 – 2023-03-06 (×2): qty 60, 30d supply, fill #0

## 2023-03-04 MED ORDER — HYDROCODONE-ACETAMINOPHEN 5-325 MG PO TABS
1.0000 | ORAL_TABLET | Freq: Three times a day (TID) | ORAL | 0 refills | Status: DC
Start: 2023-03-04 — End: 2023-05-29
  Filled 2023-03-04 – 2023-05-02 (×3): qty 90, 30d supply, fill #0

## 2023-03-04 MED ORDER — AMPHETAMINE-DEXTROAMPHETAMINE 10 MG PO TABS
20.0000 mg | ORAL_TABLET | Freq: Every day | ORAL | 0 refills | Status: DC
Start: 2023-03-04 — End: 2023-05-29
  Filled 2023-03-04 – 2023-05-02 (×3): qty 60, 30d supply, fill #0

## 2023-03-04 MED ORDER — AMPHETAMINE-DEXTROAMPHET ER 30 MG PO CP24
30.0000 mg | ORAL_CAPSULE | Freq: Every morning | ORAL | 0 refills | Status: DC
Start: 2023-03-04 — End: 2023-05-29
  Filled 2023-03-04 – 2023-04-14 (×3): qty 30, 30d supply, fill #0

## 2023-03-04 MED ORDER — AMPHETAMINE-DEXTROAMPHETAMINE 10 MG PO TABS
20.0000 mg | ORAL_TABLET | Freq: Every evening | ORAL | 0 refills | Status: DC
Start: 2023-03-04 — End: 2023-11-18
  Filled 2023-03-04 – 2023-04-04 (×3): qty 60, 30d supply, fill #0

## 2023-03-04 MED ORDER — HYDROCODONE-ACETAMINOPHEN 5-325 MG PO TABS
1.0000 | ORAL_TABLET | Freq: Three times a day (TID) | ORAL | 0 refills | Status: DC
Start: 2023-04-02 — End: 2023-05-29
  Filled 2023-03-04: qty 90, fill #0
  Filled 2023-04-01 – 2023-04-04 (×3): qty 90, 30d supply, fill #0

## 2023-03-04 MED ORDER — GABAPENTIN 300 MG PO CAPS
ORAL_CAPSULE | ORAL | 1 refills | Status: DC
Start: 2023-03-04 — End: 2023-05-29
  Filled 2023-03-04: qty 90, 30d supply, fill #0
  Filled 2023-04-01: qty 90, 30d supply, fill #1
  Filled 2023-05-01: qty 90, 30d supply, fill #2

## 2023-03-04 MED ORDER — AMPHETAMINE-DEXTROAMPHET ER 30 MG PO CP24
30.0000 mg | ORAL_CAPSULE | Freq: Every morning | ORAL | 0 refills | Status: DC
Start: 2023-03-04 — End: 2023-11-18
  Filled 2023-03-04 – 2023-05-15 (×2): qty 30, 30d supply, fill #0

## 2023-03-04 MED ORDER — AMPHETAMINE-DEXTROAMPHET ER 30 MG PO CP24
30.0000 mg | ORAL_CAPSULE | Freq: Every morning | ORAL | 0 refills | Status: DC
Start: 2023-03-04 — End: 2023-05-29
  Filled 2023-03-04: qty 30, fill #0
  Filled 2023-03-06 – 2023-03-14 (×2): qty 30, 30d supply, fill #0

## 2023-03-04 MED ORDER — HYDROCODONE-ACETAMINOPHEN 5-325 MG PO TABS
ORAL_TABLET | ORAL | 0 refills | Status: DC
Start: 2023-03-04 — End: 2023-05-29
  Filled 2023-03-04: qty 90, fill #0
  Filled 2023-03-06: qty 90, 30d supply, fill #0
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
## 2023-03-05 ENCOUNTER — Other Ambulatory Visit (HOSPITAL_COMMUNITY): Payer: Self-pay

## 2023-03-05 ENCOUNTER — Other Ambulatory Visit: Payer: Self-pay

## 2023-03-06 ENCOUNTER — Other Ambulatory Visit (HOSPITAL_COMMUNITY): Payer: Self-pay

## 2023-03-06 ENCOUNTER — Other Ambulatory Visit: Payer: Self-pay

## 2023-03-14 ENCOUNTER — Other Ambulatory Visit (HOSPITAL_COMMUNITY): Payer: Self-pay

## 2023-03-16 ENCOUNTER — Other Ambulatory Visit: Payer: Self-pay

## 2023-04-02 ENCOUNTER — Other Ambulatory Visit (HOSPITAL_COMMUNITY): Payer: Self-pay

## 2023-04-02 ENCOUNTER — Other Ambulatory Visit: Payer: Self-pay

## 2023-04-04 ENCOUNTER — Other Ambulatory Visit (HOSPITAL_COMMUNITY): Payer: Self-pay

## 2023-04-06 ENCOUNTER — Other Ambulatory Visit (HOSPITAL_COMMUNITY): Payer: Self-pay

## 2023-04-15 ENCOUNTER — Other Ambulatory Visit: Payer: Self-pay

## 2023-05-01 ENCOUNTER — Other Ambulatory Visit (HOSPITAL_BASED_OUTPATIENT_CLINIC_OR_DEPARTMENT_OTHER): Payer: Self-pay

## 2023-05-01 ENCOUNTER — Other Ambulatory Visit (HOSPITAL_COMMUNITY): Payer: Self-pay

## 2023-05-01 ENCOUNTER — Other Ambulatory Visit: Payer: Self-pay

## 2023-05-04 ENCOUNTER — Other Ambulatory Visit: Payer: Self-pay

## 2023-05-11 ENCOUNTER — Other Ambulatory Visit (HOSPITAL_COMMUNITY): Payer: Self-pay

## 2023-05-15 ENCOUNTER — Other Ambulatory Visit: Payer: Self-pay

## 2023-05-15 ENCOUNTER — Other Ambulatory Visit (HOSPITAL_COMMUNITY): Payer: Self-pay

## 2023-05-26 ENCOUNTER — Telehealth: Payer: Managed Care, Other (non HMO) | Admitting: Internal Medicine

## 2023-05-29 ENCOUNTER — Telehealth (INDEPENDENT_AMBULATORY_CARE_PROVIDER_SITE_OTHER): Payer: Managed Care, Other (non HMO) | Admitting: Internal Medicine

## 2023-05-29 ENCOUNTER — Encounter: Payer: Self-pay | Admitting: Internal Medicine

## 2023-05-29 ENCOUNTER — Other Ambulatory Visit: Payer: Self-pay

## 2023-05-29 ENCOUNTER — Other Ambulatory Visit (HOSPITAL_COMMUNITY): Payer: Self-pay

## 2023-05-29 DIAGNOSIS — G4700 Insomnia, unspecified: Secondary | ICD-10-CM | POA: Diagnosis not present

## 2023-05-29 DIAGNOSIS — M5440 Lumbago with sciatica, unspecified side: Secondary | ICD-10-CM | POA: Diagnosis not present

## 2023-05-29 DIAGNOSIS — F331 Major depressive disorder, recurrent, moderate: Secondary | ICD-10-CM

## 2023-05-29 DIAGNOSIS — F909 Attention-deficit hyperactivity disorder, unspecified type: Secondary | ICD-10-CM

## 2023-05-29 DIAGNOSIS — G8929 Other chronic pain: Secondary | ICD-10-CM

## 2023-05-29 MED ORDER — HYDROCODONE-ACETAMINOPHEN 5-325 MG PO TABS
ORAL_TABLET | ORAL | 0 refills | Status: DC
Start: 2023-05-29 — End: 2023-08-31
  Filled 2023-05-29: qty 90, fill #0
  Filled 2023-06-01: qty 90, 30d supply, fill #0

## 2023-05-29 MED ORDER — AMPHETAMINE-DEXTROAMPHET ER 30 MG PO CP24
30.0000 mg | ORAL_CAPSULE | Freq: Every morning | ORAL | 0 refills | Status: DC
Start: 2023-05-29 — End: 2023-08-31
  Filled 2023-05-29 – 2023-08-16 (×2): qty 30, 30d supply, fill #0

## 2023-05-29 MED ORDER — HYDROCODONE-ACETAMINOPHEN 5-325 MG PO TABS
1.0000 | ORAL_TABLET | Freq: Three times a day (TID) | ORAL | 0 refills | Status: DC
Start: 2023-05-29 — End: 2023-08-31
  Filled 2023-05-29 – 2023-06-29 (×2): qty 90, 30d supply, fill #0

## 2023-05-29 MED ORDER — AMPHETAMINE-DEXTROAMPHETAMINE 10 MG PO TABS
20.0000 mg | ORAL_TABLET | Freq: Every day | ORAL | 0 refills | Status: DC
Start: 2023-05-29 — End: 2023-08-31
  Filled 2023-05-29 – 2023-06-01 (×2): qty 60, 30d supply, fill #0

## 2023-05-29 MED ORDER — AMPHETAMINE-DEXTROAMPHETAMINE 10 MG PO TABS
20.0000 mg | ORAL_TABLET | Freq: Every day | ORAL | 0 refills | Status: DC
Start: 2023-05-29 — End: 2023-08-31
  Filled 2023-05-29: qty 60, fill #0
  Filled 2023-06-28: qty 60, 30d supply, fill #0

## 2023-05-29 MED ORDER — AMPHETAMINE-DEXTROAMPHET ER 30 MG PO CP24
30.0000 mg | ORAL_CAPSULE | Freq: Every morning | ORAL | 0 refills | Status: DC
Start: 2023-05-29 — End: 2023-08-31
  Filled 2023-05-29 – 2023-06-15 (×2): qty 30, 30d supply, fill #0
  Filled 2023-07-16: qty 11, 11d supply, fill #0
  Filled 2023-07-17: qty 19, 19d supply, fill #0

## 2023-05-29 MED ORDER — ALPRAZOLAM 0.5 MG PO TABS
0.5000 mg | ORAL_TABLET | Freq: Every evening | ORAL | 2 refills | Status: DC | PRN
Start: 2023-05-29 — End: 2023-08-31
  Filled 2023-05-29: qty 30, 30d supply, fill #0
  Filled 2023-06-28: qty 30, 30d supply, fill #1
  Filled 2023-07-27: qty 30, 30d supply, fill #2

## 2023-05-29 MED ORDER — HYDROCODONE-ACETAMINOPHEN 5-325 MG PO TABS
1.0000 | ORAL_TABLET | Freq: Three times a day (TID) | ORAL | 0 refills | Status: DC
Start: 2023-05-29 — End: 2023-08-31
  Filled 2023-05-29 – 2023-07-28 (×5): qty 90, 30d supply, fill #0

## 2023-05-29 MED ORDER — GABAPENTIN 300 MG PO CAPS
ORAL_CAPSULE | ORAL | 1 refills | Status: DC
Start: 2023-05-29 — End: 2023-08-31
  Filled 2023-05-29: qty 90, 30d supply, fill #0
  Filled 2023-07-16: qty 90, 30d supply, fill #1
  Filled 2023-08-16: qty 90, 30d supply, fill #2

## 2023-05-29 MED ORDER — BUPROPION HCL ER (SR) 200 MG PO TB12
200.0000 mg | ORAL_TABLET | Freq: Two times a day (BID) | ORAL | 1 refills | Status: DC
Start: 2023-05-29 — End: 2023-08-31
  Filled 2023-05-29: qty 180, 90d supply, fill #0
  Filled 2023-06-15: qty 60, 30d supply, fill #0
  Filled 2023-07-16: qty 60, 30d supply, fill #1
  Filled 2023-08-16: qty 60, 30d supply, fill #2

## 2023-05-29 MED ORDER — AMPHETAMINE-DEXTROAMPHET ER 30 MG PO CP24
30.0000 mg | ORAL_CAPSULE | ORAL | 0 refills | Status: DC
  Filled 2023-05-29 – 2023-06-15 (×2): qty 30, 30d supply, fill #0

## 2023-05-29 MED ORDER — AMPHETAMINE-DEXTROAMPHETAMINE 10 MG PO TABS
20.0000 mg | ORAL_TABLET | Freq: Every day | ORAL | 0 refills | Status: DC
Start: 2023-05-29 — End: 2023-08-31
  Filled 2023-05-29 – 2023-07-28 (×4): qty 60, 30d supply, fill #0

## 2023-05-29 NOTE — Progress Notes (Signed)
 Per patient no change in vitals since last visit, unable to obtain new vitals due to telehealth visit
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
## 2023-06-01 ENCOUNTER — Other Ambulatory Visit: Payer: Self-pay

## 2023-06-01 ENCOUNTER — Other Ambulatory Visit (HOSPITAL_COMMUNITY): Payer: Self-pay

## 2023-06-05 ENCOUNTER — Other Ambulatory Visit (HOSPITAL_COMMUNITY): Payer: Self-pay

## 2023-06-15 ENCOUNTER — Other Ambulatory Visit (HOSPITAL_COMMUNITY): Payer: Self-pay

## 2023-06-16 ENCOUNTER — Other Ambulatory Visit: Payer: Self-pay

## 2023-06-29 ENCOUNTER — Other Ambulatory Visit (HOSPITAL_COMMUNITY): Payer: Self-pay

## 2023-07-01 ENCOUNTER — Other Ambulatory Visit: Payer: Self-pay

## 2023-07-16 ENCOUNTER — Other Ambulatory Visit: Payer: Self-pay

## 2023-07-16 ENCOUNTER — Other Ambulatory Visit (HOSPITAL_COMMUNITY): Payer: Self-pay

## 2023-07-17 ENCOUNTER — Other Ambulatory Visit (HOSPITAL_COMMUNITY): Payer: Self-pay

## 2023-07-17 ENCOUNTER — Other Ambulatory Visit: Payer: Self-pay

## 2023-07-21 ENCOUNTER — Other Ambulatory Visit: Payer: Self-pay

## 2023-07-27 ENCOUNTER — Other Ambulatory Visit: Payer: Self-pay

## 2023-07-27 ENCOUNTER — Other Ambulatory Visit (HOSPITAL_COMMUNITY): Payer: Self-pay

## 2023-07-28 ENCOUNTER — Other Ambulatory Visit (HOSPITAL_COMMUNITY): Payer: Self-pay

## 2023-07-29 ENCOUNTER — Other Ambulatory Visit: Payer: Self-pay

## 2023-08-17 ENCOUNTER — Other Ambulatory Visit: Payer: Self-pay

## 2023-08-31 ENCOUNTER — Ambulatory Visit (INDEPENDENT_AMBULATORY_CARE_PROVIDER_SITE_OTHER): Payer: Managed Care, Other (non HMO) | Admitting: Internal Medicine

## 2023-08-31 ENCOUNTER — Encounter: Payer: Self-pay | Admitting: Internal Medicine

## 2023-08-31 ENCOUNTER — Encounter: Payer: Managed Care, Other (non HMO) | Admitting: Internal Medicine

## 2023-08-31 VITALS — BP 130/84 | HR 80 | Temp 98.3°F | Ht 61.5 in | Wt 144.9 lb

## 2023-08-31 DIAGNOSIS — Z1159 Encounter for screening for other viral diseases: Secondary | ICD-10-CM

## 2023-08-31 DIAGNOSIS — M5440 Lumbago with sciatica, unspecified side: Secondary | ICD-10-CM

## 2023-08-31 DIAGNOSIS — J45909 Unspecified asthma, uncomplicated: Secondary | ICD-10-CM

## 2023-08-31 DIAGNOSIS — F331 Major depressive disorder, recurrent, moderate: Secondary | ICD-10-CM | POA: Diagnosis not present

## 2023-08-31 DIAGNOSIS — G4700 Insomnia, unspecified: Secondary | ICD-10-CM

## 2023-08-31 DIAGNOSIS — R030 Elevated blood-pressure reading, without diagnosis of hypertension: Secondary | ICD-10-CM

## 2023-08-31 DIAGNOSIS — F909 Attention-deficit hyperactivity disorder, unspecified type: Secondary | ICD-10-CM

## 2023-08-31 DIAGNOSIS — G8929 Other chronic pain: Secondary | ICD-10-CM

## 2023-08-31 DIAGNOSIS — Z0000 Encounter for general adult medical examination without abnormal findings: Secondary | ICD-10-CM | POA: Diagnosis not present

## 2023-08-31 LAB — COMPREHENSIVE METABOLIC PANEL
ALT: 11 U/L (ref 0–35)
AST: 12 U/L (ref 0–37)
Albumin: 4.6 g/dL (ref 3.5–5.2)
Alkaline Phosphatase: 53 U/L (ref 39–117)
BUN: 13 mg/dL (ref 6–23)
CO2: 27 meq/L (ref 19–32)
Calcium: 9.3 mg/dL (ref 8.4–10.5)
Chloride: 105 meq/L (ref 96–112)
Creatinine, Ser: 0.92 mg/dL (ref 0.40–1.20)
GFR: 76.94 mL/min (ref >60.00)
Glucose, Bld: 83 mg/dL (ref 70–99)
Potassium: 4 meq/L (ref 3.5–5.1)
Sodium: 139 meq/L (ref 135–145)
Total Bilirubin: 1.3 mg/dL — ABNORMAL HIGH (ref 0.2–1.2)
Total Protein: 6.7 g/dL (ref 6.0–8.3)

## 2023-08-31 LAB — CBC WITH DIFFERENTIAL/PLATELET
Basophils Absolute: 0.1 10*3/uL (ref 0.0–0.1)
Basophils Relative: 1 % (ref 0.0–3.0)
Eosinophils Absolute: 0.1 10*3/uL (ref 0.0–0.7)
Eosinophils Relative: 1.3 % (ref 0.0–5.0)
HCT: 42.3 % (ref 36.0–46.0)
Hemoglobin: 14.1 g/dL (ref 12.0–15.0)
Lymphocytes Relative: 31 % (ref 12.0–46.0)
Lymphs Abs: 2.4 10*3/uL (ref 0.7–4.0)
MCHC: 33.4 g/dL (ref 30.0–36.0)
MCV: 96.8 fL (ref 78.0–100.0)
Monocytes Absolute: 0.5 10*3/uL (ref 0.1–1.0)
Monocytes Relative: 6 % (ref 3.0–12.0)
Neutro Abs: 4.8 10*3/uL (ref 1.4–7.7)
Neutrophils Relative %: 60.7 % (ref 43.0–77.0)
Platelets: 333 10*3/uL (ref 150.0–400.0)
RBC: 4.37 Mil/uL (ref 3.87–5.11)
RDW: 12.1 % (ref 11.5–15.5)
WBC: 7.8 10*3/uL (ref 4.0–10.5)

## 2023-08-31 LAB — TSH: TSH: 4.24 u[IU]/mL (ref 0.35–5.50)

## 2023-08-31 LAB — LIPID PANEL
Cholesterol: 155 mg/dL (ref 0–200)
HDL: 51.5 mg/dL (ref >39.00)
LDL Cholesterol: 84 mg/dL (ref 0–99)
NonHDL: 103.93
Total CHOL/HDL Ratio: 3
Triglycerides: 101 mg/dL (ref 0.0–149.0)
VLDL: 20.2 mg/dL (ref 0.0–40.0)

## 2023-08-31 LAB — VITAMIN D 25 HYDROXY (VIT D DEFICIENCY, FRACTURES): VITD: 42.2 ng/mL (ref 30.00–100.00)

## 2023-08-31 LAB — VITAMIN B12: Vitamin B-12: 490 pg/mL (ref 211–911)

## 2023-08-31 MED ORDER — AMPHETAMINE-DEXTROAMPHET ER 30 MG PO CP24: 30.0000 mg | ORAL_CAPSULE | ORAL | 0 refills | Status: DC

## 2023-08-31 MED ORDER — AMPHETAMINE-DEXTROAMPHETAMINE 10 MG PO TABS: 20.0000 mg | ORAL_TABLET | Freq: Every day | ORAL | 0 refills | Status: DC

## 2023-08-31 MED ORDER — HYDROCODONE-ACETAMINOPHEN 5-325 MG PO TABS: ORAL_TABLET | ORAL | 0 refills | Status: DC

## 2023-08-31 MED ORDER — CETIRIZINE HCL 10 MG PO TABS: 10.0000 mg | ORAL_TABLET | Freq: Every day | ORAL | 1 refills | Status: AC

## 2023-08-31 MED ORDER — BUPROPION HCL ER (SR) 200 MG PO TB12: 200.0000 mg | ORAL_TABLET | Freq: Two times a day (BID) | ORAL | 1 refills | Status: DC

## 2023-08-31 MED ORDER — GABAPENTIN 300 MG PO CAPS: ORAL_CAPSULE | ORAL | 1 refills | Status: DC

## 2023-08-31 MED ORDER — HYDROCODONE-ACETAMINOPHEN 5-325 MG PO TABS: 1.0000 | ORAL_TABLET | Freq: Three times a day (TID) | ORAL | 0 refills | Status: DC

## 2023-08-31 MED ORDER — AMPHETAMINE-DEXTROAMPHET ER 30 MG PO CP24: 30.0000 mg | ORAL_CAPSULE | Freq: Every morning | ORAL | 0 refills | Status: DC

## 2023-08-31 MED ORDER — ALPRAZOLAM 0.5 MG PO TABS
0.5000 mg | ORAL_TABLET | Freq: Every evening | ORAL | 2 refills | Status: DC | PRN
Start: 2023-08-31 — End: 2023-11-19

## 2023-08-31 MED ORDER — ALBUTEROL SULFATE HFA 108 (90 BASE) MCG/ACT IN AERS
2.0000 | INHALATION_SPRAY | Freq: Four times a day (QID) | RESPIRATORY_TRACT | 2 refills | Status: AC | PRN
Start: 2023-08-31 — End: ?
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
## 2023-09-01 LAB — HEPATITIS C ANTIBODY: Hepatitis C Ab: NONREACTIVE

## 2023-11-19 ENCOUNTER — Encounter: Payer: Self-pay | Admitting: Internal Medicine

## 2023-11-19 ENCOUNTER — Telehealth (INDEPENDENT_AMBULATORY_CARE_PROVIDER_SITE_OTHER): Payer: Managed Care, Other (non HMO) | Admitting: Internal Medicine

## 2023-11-19 VITALS — Wt 140.0 lb

## 2023-11-19 DIAGNOSIS — F331 Major depressive disorder, recurrent, moderate: Secondary | ICD-10-CM | POA: Diagnosis not present

## 2023-11-19 DIAGNOSIS — G8929 Other chronic pain: Secondary | ICD-10-CM

## 2023-11-19 DIAGNOSIS — F909 Attention-deficit hyperactivity disorder, unspecified type: Secondary | ICD-10-CM | POA: Diagnosis not present

## 2023-11-19 DIAGNOSIS — F32A Depression, unspecified: Secondary | ICD-10-CM

## 2023-11-19 DIAGNOSIS — G4700 Insomnia, unspecified: Secondary | ICD-10-CM

## 2023-11-19 DIAGNOSIS — M5440 Lumbago with sciatica, unspecified side: Secondary | ICD-10-CM

## 2023-11-19 DIAGNOSIS — F419 Anxiety disorder, unspecified: Secondary | ICD-10-CM

## 2023-11-19 MED ORDER — BUPROPION HCL ER (SR) 200 MG PO TB12: 200.0000 mg | ORAL_TABLET | Freq: Two times a day (BID) | ORAL | 1 refills | Status: DC

## 2023-11-19 MED ORDER — HYDROCODONE-ACETAMINOPHEN 5-325 MG PO TABS: 1.0000 | ORAL_TABLET | Freq: Three times a day (TID) | ORAL | 0 refills | Status: DC

## 2023-11-19 MED ORDER — ALPRAZOLAM 0.5 MG PO TABS: 0.5000 mg | ORAL_TABLET | Freq: Every evening | ORAL | 2 refills | Status: DC | PRN

## 2023-11-19 MED ORDER — HYDROCODONE-ACETAMINOPHEN 5-325 MG PO TABS: ORAL_TABLET | ORAL | 0 refills | Status: DC

## 2023-11-19 MED ORDER — AMPHETAMINE-DEXTROAMPHET ER 30 MG PO CP24: 30.0000 mg | ORAL_CAPSULE | Freq: Every morning | ORAL | 0 refills | Status: DC

## 2023-11-19 MED ORDER — AMPHETAMINE-DEXTROAMPHETAMINE 10 MG PO TABS: 20.0000 mg | ORAL_TABLET | Freq: Every day | ORAL | 0 refills | Status: DC

## 2023-11-19 MED ORDER — AMPHETAMINE-DEXTROAMPHETAMINE 10 MG PO TABS: 20.0000 mg | ORAL_TABLET | Freq: Every evening | ORAL | 0 refills | Status: DC

## 2023-11-19 MED ORDER — AMPHETAMINE-DEXTROAMPHET ER 30 MG PO CP24: 30.0000 mg | ORAL_CAPSULE | ORAL | 0 refills | Status: DC

## 2023-11-19 MED ORDER — GABAPENTIN 300 MG PO CAPS: ORAL_CAPSULE | ORAL | 1 refills | Status: DC
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
## 2023-11-19 NOTE — Progress Notes (Signed)
 Per patient no change in vitals since last visit, unable to obtain new vitals due to telehealth visit

## 2024-01-13 ENCOUNTER — Encounter: Payer: Self-pay | Admitting: Internal Medicine

## 2024-01-13 ENCOUNTER — Other Ambulatory Visit: Payer: Self-pay | Admitting: Internal Medicine

## 2024-01-13 DIAGNOSIS — G8929 Other chronic pain: Secondary | ICD-10-CM

## 2024-01-13 MED ORDER — PREDNISONE 10 MG (21) PO TBPK: ORAL_TABLET | ORAL | 0 refills | Status: DC

## 2024-02-09 ENCOUNTER — Telehealth: Admitting: Internal Medicine

## 2024-02-09 DIAGNOSIS — G8929 Other chronic pain: Secondary | ICD-10-CM

## 2024-02-09 DIAGNOSIS — G4700 Insomnia, unspecified: Secondary | ICD-10-CM

## 2024-02-09 DIAGNOSIS — F909 Attention-deficit hyperactivity disorder, unspecified type: Secondary | ICD-10-CM

## 2024-02-11 ENCOUNTER — Encounter: Payer: Self-pay | Admitting: Internal Medicine

## 2024-02-11 ENCOUNTER — Telehealth (INDEPENDENT_AMBULATORY_CARE_PROVIDER_SITE_OTHER): Admitting: Internal Medicine

## 2024-02-11 DIAGNOSIS — G8929 Other chronic pain: Secondary | ICD-10-CM | POA: Diagnosis not present

## 2024-02-11 DIAGNOSIS — F909 Attention-deficit hyperactivity disorder, unspecified type: Secondary | ICD-10-CM | POA: Diagnosis not present

## 2024-02-11 DIAGNOSIS — G4700 Insomnia, unspecified: Secondary | ICD-10-CM | POA: Diagnosis not present

## 2024-02-11 DIAGNOSIS — M5440 Lumbago with sciatica, unspecified side: Secondary | ICD-10-CM | POA: Diagnosis not present

## 2024-02-11 MED ORDER — HYDROCODONE-ACETAMINOPHEN 5-325 MG PO TABS
1.0000 | ORAL_TABLET | Freq: Three times a day (TID) | ORAL | 0 refills | Status: DC
Start: 2024-02-11 — End: 2024-05-11

## 2024-02-11 MED ORDER — AMPHETAMINE-DEXTROAMPHET ER 30 MG PO CP24: 30.0000 mg | ORAL_CAPSULE | Freq: Every morning | ORAL | 0 refills | Status: DC

## 2024-02-11 MED ORDER — HYDROCODONE-ACETAMINOPHEN 5-325 MG PO TABS: 1.0000 | ORAL_TABLET | Freq: Three times a day (TID) | ORAL | 0 refills | Status: DC

## 2024-02-11 MED ORDER — AMPHETAMINE-DEXTROAMPHETAMINE 10 MG PO TABS
20.0000 mg | ORAL_TABLET | Freq: Every day | ORAL | 0 refills | Status: DC
Start: 2024-02-11 — End: 2024-08-03

## 2024-02-11 MED ORDER — AMPHETAMINE-DEXTROAMPHETAMINE 10 MG PO TABS: 20.0000 mg | ORAL_TABLET | Freq: Every day | ORAL | 0 refills | Status: DC

## 2024-02-11 MED ORDER — HYDROCODONE-ACETAMINOPHEN 5-325 MG PO TABS
ORAL_TABLET | ORAL | 0 refills | Status: DC
Start: 2024-02-11 — End: 2024-05-11

## 2024-02-11 MED ORDER — GABAPENTIN 300 MG PO CAPS: ORAL_CAPSULE | ORAL | 1 refills | Status: DC

## 2024-02-11 MED ORDER — AMPHETAMINE-DEXTROAMPHET ER 30 MG PO CP24: 30.0000 mg | ORAL_CAPSULE | ORAL | 0 refills | Status: DC

## 2024-02-11 MED ORDER — AMPHETAMINE-DEXTROAMPHET ER 30 MG PO CP24
30.0000 mg | ORAL_CAPSULE | Freq: Every morning | ORAL | 0 refills | Status: DC
Start: 2024-02-11 — End: 2024-05-11

## 2024-02-11 MED ORDER — ALPRAZOLAM 0.5 MG PO TABS: 0.5000 mg | ORAL_TABLET | Freq: Every evening | ORAL | 2 refills | Status: DC | PRN
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
## 2024-02-11 NOTE — Progress Notes (Signed)
 Per patient no change in vitals since last visit, unable to obtain new vitals due to telehealth visit.

## 2024-05-12 ENCOUNTER — Telehealth: Admitting: Internal Medicine

## 2024-05-12 DIAGNOSIS — F909 Attention-deficit hyperactivity disorder, unspecified type: Secondary | ICD-10-CM | POA: Diagnosis not present

## 2024-05-12 DIAGNOSIS — G4700 Insomnia, unspecified: Secondary | ICD-10-CM

## 2024-05-12 DIAGNOSIS — G8929 Other chronic pain: Secondary | ICD-10-CM

## 2024-05-12 DIAGNOSIS — M5440 Lumbago with sciatica, unspecified side: Secondary | ICD-10-CM

## 2024-05-12 MED ORDER — AMPHETAMINE-DEXTROAMPHETAMINE 10 MG PO TABS: 20.0000 mg | ORAL_TABLET | Freq: Every day | ORAL | 0 refills | Status: DC

## 2024-05-12 MED ORDER — ALPRAZOLAM 0.5 MG PO TABS: 0.5000 mg | ORAL_TABLET | Freq: Every evening | ORAL | 2 refills | Status: DC | PRN

## 2024-05-12 MED ORDER — AMPHETAMINE-DEXTROAMPHETAMINE 10 MG PO TABS: 20.0000 mg | ORAL_TABLET | Freq: Every evening | ORAL | 0 refills | Status: DC

## 2024-05-12 MED ORDER — HYDROCODONE-ACETAMINOPHEN 5-325 MG PO TABS
ORAL_TABLET | ORAL | 0 refills | Status: DC
Start: 2024-05-12 — End: 2024-08-03

## 2024-05-12 MED ORDER — HYDROCODONE-ACETAMINOPHEN 5-325 MG PO TABS: 1.0000 | ORAL_TABLET | Freq: Three times a day (TID) | ORAL | 0 refills | Status: DC

## 2024-05-12 MED ORDER — AMPHETAMINE-DEXTROAMPHET ER 30 MG PO CP24: 30.0000 mg | ORAL_CAPSULE | Freq: Every morning | ORAL | 0 refills | Status: AC

## 2024-05-12 MED ORDER — AMPHETAMINE-DEXTROAMPHET ER 30 MG PO CP24: 30.0000 mg | ORAL_CAPSULE | Freq: Every morning | ORAL | 0 refills | Status: DC

## 2024-05-12 MED ORDER — AMPHETAMINE-DEXTROAMPHET ER 30 MG PO CP24: 30.0000 mg | ORAL_CAPSULE | ORAL | 0 refills | Status: DC

## 2024-05-12 NOTE — Progress Notes (Signed)
 Per patient no change in vitals since last visit, unable to obtain new vitals due to telehealth visit.
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
## 2024-08-04 ENCOUNTER — Telehealth: Admitting: Internal Medicine

## 2024-08-04 ENCOUNTER — Encounter: Payer: Self-pay | Admitting: Internal Medicine

## 2024-08-04 DIAGNOSIS — F909 Attention-deficit hyperactivity disorder, unspecified type: Secondary | ICD-10-CM

## 2024-08-04 DIAGNOSIS — M5440 Lumbago with sciatica, unspecified side: Secondary | ICD-10-CM

## 2024-08-04 DIAGNOSIS — G4700 Insomnia, unspecified: Secondary | ICD-10-CM | POA: Diagnosis not present

## 2024-08-04 DIAGNOSIS — G8929 Other chronic pain: Secondary | ICD-10-CM | POA: Diagnosis not present

## 2024-08-04 DIAGNOSIS — F331 Major depressive disorder, recurrent, moderate: Secondary | ICD-10-CM

## 2024-08-04 MED ORDER — GABAPENTIN 300 MG PO CAPS: ORAL_CAPSULE | ORAL | 1 refills | Status: DC

## 2024-08-04 MED ORDER — AMPHETAMINE-DEXTROAMPHETAMINE 10 MG PO TABS: 20.0000 mg | ORAL_TABLET | Freq: Every day | ORAL | 0 refills | Status: DC

## 2024-08-04 MED ORDER — HYDROCODONE-ACETAMINOPHEN 5-325 MG PO TABS: ORAL_TABLET | ORAL | 0 refills | Status: DC

## 2024-08-04 MED ORDER — BUPROPION HCL ER (SR) 200 MG PO TB12: 200.0000 mg | ORAL_TABLET | Freq: Two times a day (BID) | ORAL | 1 refills | Status: DC

## 2024-08-04 MED ORDER — AMPHETAMINE-DEXTROAMPHET ER 30 MG PO CP24
30.0000 mg | ORAL_CAPSULE | ORAL | 0 refills | Status: DC
Start: 2024-08-04 — End: 2024-09-15

## 2024-08-04 MED ORDER — AMPHETAMINE-DEXTROAMPHETAMINE 10 MG PO TABS: 20.0000 mg | ORAL_TABLET | Freq: Every day | ORAL | 0 refills | Status: AC

## 2024-08-04 MED ORDER — ALPRAZOLAM 0.5 MG PO TABS: 0.5000 mg | ORAL_TABLET | Freq: Every evening | ORAL | 2 refills | Status: DC | PRN

## 2024-08-04 MED ORDER — AMPHETAMINE-DEXTROAMPHET ER 30 MG PO CP24: 30.0000 mg | ORAL_CAPSULE | Freq: Every morning | ORAL | 0 refills | Status: DC

## 2024-08-04 MED ORDER — HYDROCODONE-ACETAMINOPHEN 5-325 MG PO TABS: 1.0000 | ORAL_TABLET | Freq: Three times a day (TID) | ORAL | 0 refills | Status: DC

## 2024-08-04 NOTE — Progress Notes (Signed)
 Per patient no change in vitals since last visit, unable to obtain new vitals due to telehealth visit.
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
## 2024-09-09 ENCOUNTER — Encounter: Payer: Self-pay | Admitting: Internal Medicine

## 2024-09-09 DIAGNOSIS — F909 Attention-deficit hyperactivity disorder, unspecified type: Secondary | ICD-10-CM

## 2024-09-12 NOTE — Telephone Encounter (Signed)
 Spoke to the pharmacist at [facility] and they now have the Adderall  30 Xl.  Left message on machine for patient .

## 2024-09-15 MED ORDER — AMPHETAMINE-DEXTROAMPHET ER 30 MG PO CP24: 30.0000 mg | ORAL_CAPSULE | ORAL | 0 refills | Status: DC

## 2024-09-15 NOTE — Addendum Note (Signed)
 Addended by: KATHRYNE MILLMAN B on: 09/15/2024 11:22 AM   Modules accepted: Orders

## 2024-11-11 ENCOUNTER — Other Ambulatory Visit: Payer: Self-pay | Admitting: Medical Genetics

## 2024-11-14 ENCOUNTER — Telehealth: Admitting: Internal Medicine

## 2024-11-14 ENCOUNTER — Encounter: Admitting: Internal Medicine

## 2024-11-14 ENCOUNTER — Encounter: Payer: Self-pay | Admitting: Internal Medicine

## 2024-11-14 DIAGNOSIS — F331 Major depressive disorder, recurrent, moderate: Secondary | ICD-10-CM | POA: Diagnosis not present

## 2024-11-14 DIAGNOSIS — G4700 Insomnia, unspecified: Secondary | ICD-10-CM

## 2024-11-14 DIAGNOSIS — M5440 Lumbago with sciatica, unspecified side: Secondary | ICD-10-CM

## 2024-11-14 DIAGNOSIS — G8929 Other chronic pain: Secondary | ICD-10-CM | POA: Diagnosis not present

## 2024-11-14 DIAGNOSIS — F909 Attention-deficit hyperactivity disorder, unspecified type: Secondary | ICD-10-CM

## 2024-11-14 MED ORDER — AMPHETAMINE-DEXTROAMPHETAMINE 10 MG PO TABS: 20.0000 mg | ORAL_TABLET | Freq: Every day | ORAL | 0 refills | Status: AC

## 2024-11-14 MED ORDER — HYDROCODONE-ACETAMINOPHEN 5-325 MG PO TABS: ORAL_TABLET | ORAL | 0 refills | Status: AC

## 2024-11-14 MED ORDER — AMPHETAMINE-DEXTROAMPHET ER 30 MG PO CP24: 30.0000 mg | ORAL_CAPSULE | ORAL | 0 refills | Status: AC

## 2024-11-14 MED ORDER — AMPHETAMINE-DEXTROAMPHET ER 30 MG PO CP24: 30.0000 mg | ORAL_CAPSULE | Freq: Every morning | ORAL | 0 refills | Status: AC

## 2024-11-14 MED ORDER — BUPROPION HCL ER (SR) 200 MG PO TB12: 200.0000 mg | ORAL_TABLET | Freq: Two times a day (BID) | ORAL | 1 refills | Status: AC

## 2024-11-14 MED ORDER — HYDROCODONE-ACETAMINOPHEN 5-325 MG PO TABS: 1.0000 | ORAL_TABLET | Freq: Three times a day (TID) | ORAL | 0 refills | Status: AC

## 2024-11-14 MED ORDER — GABAPENTIN 300 MG PO CAPS: ORAL_CAPSULE | ORAL | 1 refills | Status: AC

## 2024-11-14 MED ORDER — ALPRAZOLAM 0.5 MG PO TABS: 0.5000 mg | ORAL_TABLET | Freq: Every evening | ORAL | 2 refills | Status: AC | PRN

## 2024-11-14 MED ORDER — AMPHETAMINE-DEXTROAMPHETAMINE 10 MG PO TABS: 20.0000 mg | ORAL_TABLET | Freq: Every evening | ORAL | 0 refills | Status: AC

## 2024-11-14 NOTE — Progress Notes (Signed)
 Per patient no change in vitals since last visit, unable to obtain new vitals due to telehealth visit.
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
## 2025-02-20 ENCOUNTER — Encounter: Admitting: Internal Medicine
